# Patient Record
Sex: Male | Born: 1945
Health system: Southern US, Community
[De-identification: ages and names within clinical notes are randomized; demographics above are authoritative.]

## PROBLEM LIST (undated history)

## (undated) DIAGNOSIS — I208 Other forms of angina pectoris: Secondary | ICD-10-CM

## (undated) DIAGNOSIS — I2510 Atherosclerotic heart disease of native coronary artery without angina pectoris: Secondary | ICD-10-CM

## (undated) DIAGNOSIS — R0789 Other chest pain: Secondary | ICD-10-CM

## (undated) DIAGNOSIS — E785 Hyperlipidemia, unspecified: Secondary | ICD-10-CM

## (undated) DIAGNOSIS — R600 Localized edema: Secondary | ICD-10-CM

## (undated) DIAGNOSIS — I10 Essential (primary) hypertension: Secondary | ICD-10-CM

## (undated) DIAGNOSIS — G8929 Other chronic pain: Secondary | ICD-10-CM

## (undated) DIAGNOSIS — I2089 Other forms of angina pectoris: Secondary | ICD-10-CM

## (undated) HISTORY — DX: Atherosclerotic heart disease of native coronary artery without angina pectoris: I25.10

## (undated) HISTORY — DX: Other chest pain: R07.89

## (undated) HISTORY — PX: CORONARY ANGIOPLASTY: SHX604

## (undated) HISTORY — PX: REPAIR OF PERFORATED ULCER: SHX6065

## (undated) HISTORY — PX: KIDNEY STONE SURGERY: SHX686

## (undated) HISTORY — DX: Other forms of angina pectoris: I20.89

## (undated) HISTORY — DX: Other forms of angina pectoris: I20.8

## (undated) HISTORY — PX: CARDIAC CATHETERIZATION: SHX172

## (undated) HISTORY — DX: Localized edema: R60.0

## (undated) HISTORY — PX: OTHER SURGICAL HISTORY: SHX169

## (undated) HISTORY — DX: Hyperlipidemia, unspecified: E78.5

## (undated) HISTORY — DX: Essential (primary) hypertension: I10

## (undated) HISTORY — DX: Other chronic pain: G89.29

## (undated) HISTORY — PX: VASECTOMY: SHX75

---

## 1997-10-25 ENCOUNTER — Emergency Department (HOSPITAL_COMMUNITY): Admission: EM | Admit: 1997-10-25 | Discharge: 1997-10-25 | Payer: Self-pay | Admitting: Emergency Medicine

## 1997-10-25 ENCOUNTER — Encounter: Payer: Self-pay | Admitting: Emergency Medicine

## 1997-10-28 ENCOUNTER — Ambulatory Visit (HOSPITAL_COMMUNITY): Admission: RE | Admit: 1997-10-28 | Discharge: 1997-10-28 | Payer: Self-pay | Admitting: Cardiology

## 2003-08-30 DIAGNOSIS — I2510 Atherosclerotic heart disease of native coronary artery without angina pectoris: Secondary | ICD-10-CM

## 2003-08-30 HISTORY — DX: Atherosclerotic heart disease of native coronary artery without angina pectoris: I25.10

## 2003-09-10 ENCOUNTER — Inpatient Hospital Stay (HOSPITAL_COMMUNITY): Admission: EM | Admit: 2003-09-10 | Discharge: 2003-09-14 | Payer: Self-pay | Admitting: Emergency Medicine

## 2003-09-30 ENCOUNTER — Encounter (HOSPITAL_COMMUNITY): Admission: RE | Admit: 2003-09-30 | Discharge: 2003-12-29 | Payer: Self-pay | Admitting: Cardiology

## 2004-01-01 ENCOUNTER — Ambulatory Visit (HOSPITAL_COMMUNITY): Admission: RE | Admit: 2004-01-01 | Discharge: 2004-01-01 | Payer: Self-pay | Admitting: Family Medicine

## 2004-02-26 ENCOUNTER — Ambulatory Visit: Payer: Self-pay | Admitting: Internal Medicine

## 2004-05-26 ENCOUNTER — Ambulatory Visit: Payer: Self-pay

## 2004-09-08 ENCOUNTER — Ambulatory Visit: Payer: Self-pay

## 2004-11-19 ENCOUNTER — Ambulatory Visit (HOSPITAL_COMMUNITY): Admission: RE | Admit: 2004-11-19 | Discharge: 2004-11-20 | Payer: Self-pay | Admitting: Cardiology

## 2004-11-26 ENCOUNTER — Ambulatory Visit: Payer: Self-pay | Admitting: Cardiology

## 2005-06-30 ENCOUNTER — Encounter: Payer: Self-pay | Admitting: Cardiology

## 2006-04-26 ENCOUNTER — Encounter: Admission: RE | Admit: 2006-04-26 | Discharge: 2006-05-24 | Payer: Self-pay | Admitting: Neurology

## 2012-11-25 ENCOUNTER — Other Ambulatory Visit: Payer: Self-pay | Admitting: Cardiology

## 2012-11-25 DIAGNOSIS — E7800 Pure hypercholesterolemia, unspecified: Secondary | ICD-10-CM

## 2012-11-25 DIAGNOSIS — Z79899 Other long term (current) drug therapy: Secondary | ICD-10-CM

## 2012-12-04 ENCOUNTER — Other Ambulatory Visit: Payer: Self-pay

## 2012-12-05 ENCOUNTER — Other Ambulatory Visit (INDEPENDENT_AMBULATORY_CARE_PROVIDER_SITE_OTHER): Payer: Medicare Other

## 2012-12-05 DIAGNOSIS — E7800 Pure hypercholesterolemia, unspecified: Secondary | ICD-10-CM

## 2012-12-05 DIAGNOSIS — Z79899 Other long term (current) drug therapy: Secondary | ICD-10-CM

## 2012-12-05 LAB — LIPID PANEL
Cholesterol: 124 mg/dL (ref 0–200)
HDL: 38.3 mg/dL — ABNORMAL LOW (ref >39.00)
LDL Cholesterol: 66 mg/dL (ref 0–99)
Total CHOL/HDL Ratio: 3
Triglycerides: 100 mg/dL (ref 0.0–149.0)
VLDL: 20 mg/dL (ref 0.0–40.0)

## 2012-12-30 ENCOUNTER — Other Ambulatory Visit: Payer: Self-pay | Admitting: Cardiology

## 2013-02-02 ENCOUNTER — Other Ambulatory Visit: Payer: Self-pay | Admitting: Cardiology

## 2013-02-02 ENCOUNTER — Encounter: Payer: Self-pay | Admitting: General Surgery

## 2013-02-02 DIAGNOSIS — I2510 Atherosclerotic heart disease of native coronary artery without angina pectoris: Secondary | ICD-10-CM

## 2013-02-02 DIAGNOSIS — I208 Other forms of angina pectoris: Secondary | ICD-10-CM

## 2013-02-02 DIAGNOSIS — I25119 Atherosclerotic heart disease of native coronary artery with unspecified angina pectoris: Secondary | ICD-10-CM | POA: Insufficient documentation

## 2013-02-02 DIAGNOSIS — E7800 Pure hypercholesterolemia, unspecified: Secondary | ICD-10-CM

## 2013-02-02 DIAGNOSIS — Z79899 Other long term (current) drug therapy: Secondary | ICD-10-CM

## 2013-02-07 ENCOUNTER — Encounter: Payer: Self-pay | Admitting: Cardiology

## 2013-02-07 ENCOUNTER — Ambulatory Visit (INDEPENDENT_AMBULATORY_CARE_PROVIDER_SITE_OTHER): Payer: Medicare Other | Admitting: Cardiology

## 2013-02-07 VITALS — BP 142/82 | HR 64 | Wt 198.6 lb

## 2013-02-07 DIAGNOSIS — E7800 Pure hypercholesterolemia, unspecified: Secondary | ICD-10-CM

## 2013-02-07 DIAGNOSIS — R0789 Other chest pain: Secondary | ICD-10-CM | POA: Insufficient documentation

## 2013-02-07 DIAGNOSIS — Z79899 Other long term (current) drug therapy: Secondary | ICD-10-CM

## 2013-02-07 DIAGNOSIS — I209 Angina pectoris, unspecified: Secondary | ICD-10-CM

## 2013-02-07 DIAGNOSIS — I2510 Atherosclerotic heart disease of native coronary artery without angina pectoris: Secondary | ICD-10-CM

## 2013-02-07 DIAGNOSIS — I498 Other specified cardiac arrhythmias: Secondary | ICD-10-CM

## 2013-02-07 DIAGNOSIS — R001 Bradycardia, unspecified: Secondary | ICD-10-CM | POA: Insufficient documentation

## 2013-02-07 DIAGNOSIS — I208 Other forms of angina pectoris: Secondary | ICD-10-CM

## 2013-02-07 NOTE — Patient Instructions (Addendum)
Your physician recommends that you continue on your current medications as directed. Please refer to the Current Medication list given to you today.  Your physician recommends that you return for lab work on 06/07/2013 for fasting LIPID and ALT  Your physician wants you to follow-up in: 6 Months with Dr Sherlyn Lick will receive a reminder letter in the mail two months in advance. If you don't receive a letter, please call our office to schedule the follow-up appointment.

## 2013-02-07 NOTE — Progress Notes (Signed)
[address] [place], [place]  [address] Phone: [phone] Fax:  [phone]  Date:  02/07/2013   ID:  Nathan, Ayala [birth year], MRN [id number]  PCP:  Aida Puffer, MD  Cardiologist:  Armanda Magic, MD     History of Present Illness: Nathan Ayala is a 67 y.o. male with a history of ASCAD, chronic chest wall pain, dyslipidemia and HTN.  He is doing well.  He has chronic left shoulder into his left arm and down his back which is unchanged.  He has chronic stable angina and occasionally takes a NTG but has never had to take more than 1.  He says his anginal pain is very stable and has not taken a NTG in a few months.  He denies any SOB, DOE, palpitations, LE edema, syncope.   Wt Readings from Last 3 Encounters:  02/07/13 198 lb 9.6 oz (90.084 kg)  02/02/13 198 lb (89.812 kg)     Past Medical History  Diagnosis Date  . Coronary artery disease 08/2003    s/p inferior lateral myocardial infarction with PCI of Proximal RCA. He is also s/p PTCA stenting of Proximal LAD September 2006  . Atypical back pain     Chronic Secondary to musculoskeletal disease of cervical spine followed by Dr. Anne Hahn  . Atypical chest pain     Chronic Secondary to musculoskeletal disease of cervical spine followed by Dr. Anne Hahn  . Hyperlipidemia     Current Outpatient Prescriptions  Medication Sig Dispense Refill  . aspirin 81 MG tablet Take 81 mg by mouth daily.      . carisoprodol (SOMA) 350 MG tablet Take 350 mg by mouth as needed for muscle spasms.      . clopidogrel (PLAVIX) 75 MG tablet TAKE 1 TABLET DAILY  90 tablet  1  . Coenzyme Q10 (COQ-10) 200 MG CAPS Take 1 capsule by mouth daily.      . CRESTOR 5 MG tablet TAKE ONE-HALF (1/2) TABLET ONCE A DAY  45 tablet  1  . isosorbide mononitrate (IMDUR) 30 MG 24 hr tablet TAKE 1 TABLET DAILY  90 tablet  1  . nitroGLYCERIN (NITROSTAT) 0.4 MG SL tablet Place 0.4 mg under the tongue every 5 (five) minutes as needed for chest pain.      .  Omega-3 Fatty Acids (FISH OIL) 1000 MG CAPS Take 4 capsules by mouth daily.      . pantoprazole (PROTONIX) 40 MG tablet Take 40 mg by mouth as needed.       . vitamin B-12 (CYANOCOBALAMIN) 1000 MCG tablet Take 1,000 mcg by mouth daily.       No current facility-administered medications for this visit.    Allergies:   No Known Allergies  Social History:  The patient  reports that he quit smoking about 42 years ago. His smoking use included Cigarettes. He smoked 0.00 packs per day. He does not have any smokeless tobacco history on file. He reports that he does not drink alcohol or use illicit drugs.   Family History:  The patient's family history includes Diabetes in his mother; Hypertension in his mother; Kidney cancer in his mother.   ROS:  Please see the history of present illness.      All other systems reviewed and negative.   PHYSICAL EXAM: VS:  BP 142/82  Pulse 64  Wt 198 lb 9.6 oz (90.084 kg) Well nourished, well developed, in no acute distress HEENT: normal Neck: no JVD Cardiac:  normal S1, S2; RRR; no murmur Lungs:  clear to auscultation bilaterally, no wheezing, rhonchi or rales Abd: soft, nontender, no hepatomegaly Ext: no edema Skin: warm and dry Neuro:  CNs 2-12 intact, no focal abnormalities noted  EKG:  Sinus bradycardia     ASSESSMENT AND PLAN:  1. ASCAD with chronic stable angina  - continue ASA/Plavix/Imdur 2. Asymptomatic bradycardia 3. Dyslipidemia  - lipids are at goal - will recheck again 05/2013  Followup with me in 6 months    Signed, Armanda Magic, MD 02/07/2013 11:56 AM

## 2013-05-25 ENCOUNTER — Other Ambulatory Visit: Payer: Self-pay | Admitting: Cardiology

## 2013-06-07 ENCOUNTER — Other Ambulatory Visit: Payer: Medicare Other

## 2013-06-14 ENCOUNTER — Other Ambulatory Visit (INDEPENDENT_AMBULATORY_CARE_PROVIDER_SITE_OTHER): Payer: Medicare Other

## 2013-06-14 DIAGNOSIS — E7800 Pure hypercholesterolemia, unspecified: Secondary | ICD-10-CM

## 2013-06-14 LAB — LIPID PANEL
CHOLESTEROL: 123 mg/dL (ref 0–200)
HDL: 35.4 mg/dL — ABNORMAL LOW (ref >39.00)
LDL Cholesterol: 74 mg/dL (ref 0–99)
Total CHOL/HDL Ratio: 3
Triglycerides: 69 mg/dL (ref 0.0–149.0)
VLDL: 13.8 mg/dL (ref 0.0–40.0)

## 2013-06-14 LAB — ALT: ALT: 28 U/L (ref 0–53)

## 2013-06-20 ENCOUNTER — Other Ambulatory Visit: Payer: Self-pay | Admitting: General Surgery

## 2013-06-20 ENCOUNTER — Encounter: Payer: Self-pay | Admitting: General Surgery

## 2013-06-20 DIAGNOSIS — E7800 Pure hypercholesterolemia, unspecified: Secondary | ICD-10-CM

## 2013-07-06 ENCOUNTER — Telehealth: Payer: Self-pay | Admitting: Cardiology

## 2013-07-06 NOTE — Telephone Encounter (Signed)
New message ° ° ° ° °Want lab results °

## 2013-07-06 NOTE — Telephone Encounter (Signed)
Pt address was different then what was on file. Updated for pt and went over lab results with him.

## 2013-07-27 ENCOUNTER — Other Ambulatory Visit: Payer: Self-pay | Admitting: *Deleted

## 2013-07-27 MED ORDER — ISOSORBIDE MONONITRATE ER 30 MG PO TB24: ORAL_TABLET | ORAL | Status: DC

## 2013-08-02 ENCOUNTER — Ambulatory Visit (INDEPENDENT_AMBULATORY_CARE_PROVIDER_SITE_OTHER): Payer: Medicare Other | Admitting: Cardiology

## 2013-08-02 ENCOUNTER — Encounter: Payer: Self-pay | Admitting: Cardiology

## 2013-08-02 VITALS — BP 132/78 | HR 66 | Ht 67.5 in | Wt 203.4 lb

## 2013-08-02 DIAGNOSIS — I2510 Atherosclerotic heart disease of native coronary artery without angina pectoris: Secondary | ICD-10-CM

## 2013-08-02 DIAGNOSIS — E7800 Pure hypercholesterolemia, unspecified: Secondary | ICD-10-CM

## 2013-08-02 DIAGNOSIS — I208 Other forms of angina pectoris: Secondary | ICD-10-CM

## 2013-08-02 DIAGNOSIS — I498 Other specified cardiac arrhythmias: Secondary | ICD-10-CM

## 2013-08-02 DIAGNOSIS — I209 Angina pectoris, unspecified: Secondary | ICD-10-CM

## 2013-08-02 DIAGNOSIS — R001 Bradycardia, unspecified: Secondary | ICD-10-CM

## 2013-08-02 MED ORDER — PANTOPRAZOLE SODIUM 40 MG PO TBEC: 40.0000 mg | DELAYED_RELEASE_TABLET | ORAL | Status: DC | PRN

## 2013-08-02 MED ORDER — FISH OIL 1000 MG PO CAPS: 6.0000 | ORAL_CAPSULE | Freq: Every day | ORAL | Status: DC

## 2013-08-02 NOTE — Progress Notes (Signed)
[facility], [place], [place]  [address] Phone: [phone] Fax:  [phone]  Date:  08/02/2013   ID:  Overton, Boggus 11/27/1945, MRN [id number]  PCP:  Tamsen Roers, MD  Cardiologist:  Fransico Him, MD     History of Present Illness: Nathan Ayala is a 68 y.o. male with a history of ASCAD, chronic chest wall pain, dyslipidemia and HTN. He is doing well. He has chronic stable angina and occasionally takes a NTG but has never had to take more than 1. He says his anginal pain is very stable and has not taken a NTG in a few months. He denies any SOB, DOE, palpitations, syncope.  He has had some LE edema which he attributes to being on the Prednisone for recent ruptured lumbar disc.     Wt Readings from Last 3 Encounters:  08/02/13 203 lb 6.4 oz (92.262 kg)  02/07/13 198 lb 9.6 oz (90.084 kg)  02/02/13 198 lb (89.812 kg)     Past Medical History  Diagnosis Date  . Coronary artery disease 08/2003    s/p inferior lateral myocardial infarction with PCI of Proximal RCA. He is also s/p PTCA stenting of Proximal LAD September 2006  . Atypical back pain     Chronic Secondary to musculoskeletal disease of cervical spine followed by Dr. Jannifer Franklin  . Atypical chest pain     Chronic Secondary to musculoskeletal disease of cervical spine followed by Dr. Jannifer Franklin  . Hyperlipidemia   . Stable angina     Current Outpatient Prescriptions  Medication Sig Dispense Refill  . aspirin 81 MG tablet Take 81 mg by mouth daily.      . carisoprodol (SOMA) 350 MG tablet Take 350 mg by mouth as needed for muscle spasms.      . clopidogrel (PLAVIX) 75 MG tablet TAKE 1 TABLET DAILY  90 tablet  0  . Coenzyme Q10 (COQ-10) 200 MG CAPS Take 1 capsule by mouth daily.      . CRESTOR 5 MG tablet TAKE ONE-HALF (1/2) TABLET ONCE A DAY  45 tablet  0  . isosorbide mononitrate (IMDUR) 30 MG 24 hr tablet TAKE 1 TABLET DAILY  90 tablet  0  . nitroGLYCERIN (NITROSTAT) 0.4 MG SL tablet Place 0.4 mg under the  tongue every 5 (five) minutes as needed for chest pain.      . Omega-3 Fatty Acids (FISH OIL) 1000 MG CAPS Take 4 capsules by mouth daily.      . pantoprazole (PROTONIX) 40 MG tablet Take 40 mg by mouth as needed.       . vitamin B-12 (CYANOCOBALAMIN) 1000 MCG tablet Take 1,000 mcg by mouth daily.       No current facility-administered medications for this visit.    Allergies:   No Known Allergies  Social History:  The patient  reports that he quit smoking about 43 years ago. His smoking use included Cigarettes. He smoked 0.00 packs per day. He does not have any smokeless tobacco history on file. He reports that he does not drink alcohol or use illicit drugs.   Family History:  The patient's family history includes Diabetes in his mother; Hypertension in his mother; Kidney cancer in his mother.   ROS:  Please see the history of present illness.      All other systems reviewed and negative.   PHYSICAL EXAM: VS:  BP 132/78  Pulse 66  Ht 5' 7.5" (1.715 m)  Wt 203 lb 6.4  oz (92.262 kg)  BMI 31.37 kg/m2 Well nourished, well developed, in no acute distress HEENT: normal Neck: no JVD Cardiac:  normal S1, S2; RRR; no murmur Lungs:  clear to auscultation bilaterally, no wheezing, rhonchi or rales Abd: soft, nontender, no hepatomegaly Ext: no edema Skin: warm and dry Neuro:  CNs 2-12 intact, no focal abnormalities noted     ASSESSMENT AND PLAN:  1.  ASCAD with chronic stable angina - continue ASA/Plavix/Imdur  2.  Asymptomatic bradycardia - resolved 3.  Dyslipidemia - lipids are at goal - LDL was 74 but his HDL has dropped to 35 on 05/2013 - continue Crestor - increase fish oil to 3 tablets twice daily  Followup with me in 6 months  Signed, Fransico Him, MD 08/02/2013 11:10 AM

## 2013-08-02 NOTE — Patient Instructions (Signed)
Your physician has recommended you make the following change in your medication:   1. Increase Fish Oil to 6 capsules daily.   Your physician wants you to follow-up in: 6 months with Dr. Radford Pax. You will receive a reminder letter in the mail two months in advance. If you don't receive a letter, please call our office to schedule the follow-up appointment.

## 2013-08-05 ENCOUNTER — Other Ambulatory Visit: Payer: Self-pay | Admitting: Cardiology

## 2013-10-03 ENCOUNTER — Telehealth: Payer: Self-pay | Admitting: Cardiology

## 2013-10-03 NOTE — Telephone Encounter (Signed)
To Dr Turner to advise 

## 2013-10-03 NOTE — Telephone Encounter (Signed)
That is fine 

## 2013-10-03 NOTE — Telephone Encounter (Signed)
Patient has an appt on 10-22 for lab work.  His primary provider (Dr. Tamsen Roers) would like to know if we can draw additional labs and send results to [facility]:VGJF, HbA1C and Vitamins D and B12.  Please let patient know if we can do this so he can get the lab done somewhere else if we cannot draw the additional labs.  I sent a copy of the order on a prescription to medical records to be scanned to patient's chart.

## 2013-10-04 ENCOUNTER — Other Ambulatory Visit: Payer: Self-pay | Admitting: General Surgery

## 2013-10-04 DIAGNOSIS — Z5181 Encounter for therapeutic drug level monitoring: Secondary | ICD-10-CM

## 2013-10-04 DIAGNOSIS — Z79899 Other long term (current) drug therapy: Secondary | ICD-10-CM

## 2013-10-04 DIAGNOSIS — D518 Other vitamin B12 deficiency anemias: Secondary | ICD-10-CM

## 2013-10-04 NOTE — Telephone Encounter (Signed)
Ordered for pt

## 2013-10-04 NOTE — Telephone Encounter (Signed)
Pt is aware.  

## 2013-10-04 NOTE — Telephone Encounter (Signed)
Will have to call pts PCP office to get DX codes, Rx has not yet been scanned in to pts chart. [phone]

## 2013-10-06 ENCOUNTER — Other Ambulatory Visit: Payer: Self-pay | Admitting: Cardiology

## 2013-12-05 ENCOUNTER — Encounter: Payer: Self-pay | Admitting: Cardiology

## 2013-12-05 ENCOUNTER — Telehealth: Payer: Self-pay | Admitting: Cardiology

## 2013-12-05 NOTE — Telephone Encounter (Signed)
New message   Patient calling stating someone called him today - returning call back to nurse.

## 2013-12-05 NOTE — Telephone Encounter (Signed)
Spoke with patient and he had no message just number came up on phone. Advised patient did not see where anyone called him. Told him did not look like anyone tried to call him but would check with Amy S CMA to be sure. Did remind patient of lab appointment later this month.

## 2013-12-20 ENCOUNTER — Other Ambulatory Visit (INDEPENDENT_AMBULATORY_CARE_PROVIDER_SITE_OTHER): Payer: Medicare Other | Admitting: *Deleted

## 2013-12-20 DIAGNOSIS — Z5181 Encounter for therapeutic drug level monitoring: Secondary | ICD-10-CM

## 2013-12-20 DIAGNOSIS — D518 Other vitamin B12 deficiency anemias: Secondary | ICD-10-CM | POA: Diagnosis not present

## 2013-12-20 DIAGNOSIS — Z79899 Other long term (current) drug therapy: Secondary | ICD-10-CM | POA: Diagnosis not present

## 2013-12-20 DIAGNOSIS — E7800 Pure hypercholesterolemia, unspecified: Secondary | ICD-10-CM

## 2013-12-20 LAB — BASIC METABOLIC PANEL
BUN: 20 mg/dL (ref 6–23)
CALCIUM: 9.7 mg/dL (ref 8.4–10.5)
CO2: 25 mEq/L (ref 19–32)
Chloride: 107 mEq/L (ref 96–112)
Creatinine, Ser: 1.4 mg/dL (ref 0.4–1.5)
GFR: 54.57 mL/min — AB (ref >60.00)
Glucose, Bld: 96 mg/dL (ref 70–99)
POTASSIUM: 5.7 meq/L — AB (ref 3.5–5.1)
SODIUM: 141 meq/L (ref 135–145)

## 2013-12-20 LAB — HEPATIC FUNCTION PANEL
ALT: 28 U/L (ref 0–53)
AST: 23 U/L (ref 0–37)
Albumin: 3.8 g/dL (ref 3.5–5.2)
Alkaline Phosphatase: 66 U/L (ref 39–117)
Bilirubin, Direct: 0.3 mg/dL (ref 0.0–0.3)
TOTAL PROTEIN: 7.3 g/dL (ref 6.0–8.3)
Total Bilirubin: 2.4 mg/dL — ABNORMAL HIGH (ref 0.2–1.2)

## 2013-12-20 LAB — LIPID PANEL
Cholesterol: 134 mg/dL (ref 0–200)
HDL: 36.7 mg/dL — AB (ref >39.00)
LDL CALC: 81 mg/dL (ref 0–99)
NONHDL: 97.3
TRIGLYCERIDES: 82 mg/dL (ref 0.0–149.0)
Total CHOL/HDL Ratio: 4
VLDL: 16.4 mg/dL (ref 0.0–40.0)

## 2013-12-20 LAB — VITAMIN B12: VITAMIN B 12: 470 pg/mL (ref 211–911)

## 2013-12-20 LAB — VITAMIN D 25 HYDROXY (VIT D DEFICIENCY, FRACTURES): VITD: 38.96 ng/mL (ref 30.00–100.00)

## 2013-12-20 LAB — HEMOGLOBIN A1C: HEMOGLOBIN A1C: 5.6 % (ref 4.6–6.5)

## 2013-12-21 ENCOUNTER — Other Ambulatory Visit: Payer: Self-pay

## 2013-12-21 ENCOUNTER — Telehealth: Payer: Self-pay

## 2013-12-21 DIAGNOSIS — E7800 Pure hypercholesterolemia, unspecified: Secondary | ICD-10-CM

## 2013-12-21 MED ORDER — ROSUVASTATIN CALCIUM 5 MG PO TABS: 5.0000 mg | ORAL_TABLET | Freq: Every day | ORAL | Status: DC

## 2013-12-21 NOTE — Telephone Encounter (Signed)
Informed Nathan Ayala that LDL not at goal.  Instructed patient to increase crestor 5mg  to 1 tablet daily. Appointment made to recheck FLP and ALT in 6 weeks (Dec 2nd). Forwarded all labs to Dr. Rex Kras to followup on borderline low Vit D and elevated Bili. Patient agrees with treatment plan.

## 2013-12-23 ENCOUNTER — Other Ambulatory Visit: Payer: Self-pay | Admitting: Cardiology

## 2013-12-29 ENCOUNTER — Other Ambulatory Visit: Payer: Self-pay | Admitting: Cardiology

## 2014-01-30 ENCOUNTER — Encounter: Payer: Self-pay | Admitting: Cardiology

## 2014-01-30 ENCOUNTER — Other Ambulatory Visit (INDEPENDENT_AMBULATORY_CARE_PROVIDER_SITE_OTHER): Payer: Medicare Other | Admitting: *Deleted

## 2014-01-30 ENCOUNTER — Ambulatory Visit (INDEPENDENT_AMBULATORY_CARE_PROVIDER_SITE_OTHER): Payer: Medicare Other | Admitting: Cardiology

## 2014-01-30 VITALS — BP 118/88 | HR 64 | Resp 18 | Ht 68.0 in | Wt 200.4 lb

## 2014-01-30 DIAGNOSIS — E7800 Pure hypercholesterolemia, unspecified: Secondary | ICD-10-CM

## 2014-01-30 DIAGNOSIS — I25118 Atherosclerotic heart disease of native coronary artery with other forms of angina pectoris: Secondary | ICD-10-CM

## 2014-01-30 DIAGNOSIS — R001 Bradycardia, unspecified: Secondary | ICD-10-CM

## 2014-01-30 DIAGNOSIS — I2510 Atherosclerotic heart disease of native coronary artery without angina pectoris: Secondary | ICD-10-CM

## 2014-01-30 DIAGNOSIS — I208 Other forms of angina pectoris: Secondary | ICD-10-CM

## 2014-01-30 NOTE — Patient Instructions (Signed)
Your physician recommends that you continue on your current medications as directed. Please refer to the Current Medication list given to you today.    Your physician wants you to follow-up in: 6 months with DR TURNER  You will receive a reminder letter in the mail two months in advance. If you don't receive a letter, please call our office to schedule the follow-up appointment.

## 2014-01-30 NOTE — Progress Notes (Signed)
[facility], [place], [place]  [address] Phone: [phone] Fax:  [phone]  Date:  01/30/2014   ID:  Arlon, Bleier 1946-02-13, MRN [id number]  PCP:  Tamsen Roers, MD  Cardiologist:  Fransico Him, MD    History of Present Illness: Nathan Ayala is a 68 y.o. male with a history of ASCAD, chronic chest wall pain, dyslipidemia and HTN. He is doing well. He has chronic stable angina and occasionally takes a NTG but has never had to take more than 1. He says his anginal pain is very stable and has not taken a NTG in 6 months. He denies any SOB, DOE, palpitations, syncope. His LE edema has resolved.  Wt Readings from Last 3 Encounters:  01/30/14 200 lb 6.4 oz (90.901 kg)  08/02/13 203 lb 6.4 oz (92.262 kg)  02/07/13 198 lb 9.6 oz (90.084 kg)     Past Medical History  Diagnosis Date  . Coronary artery disease 08/2003    s/p inferior lateral myocardial infarction with PCI of Proximal RCA. He is also s/p PTCA stenting of Proximal LAD September 2006  . Atypical back pain     Chronic Secondary to musculoskeletal disease of cervical spine followed by Dr. Jannifer Franklin  . Atypical chest pain     Chronic Secondary to musculoskeletal disease of cervical spine followed by Dr. Jannifer Franklin  . Hyperlipidemia   . Stable angina     Current Outpatient Prescriptions  Medication Sig Dispense Refill  . aspirin 81 MG tablet Take 81 mg by mouth daily.    . carisoprodol (SOMA) 350 MG tablet Take 350 mg by mouth as needed for muscle spasms.    . clopidogrel (PLAVIX) 75 MG tablet TAKE 1 TABLET DAILY 90 tablet 0  . Coenzyme Q10 (COQ-10) 200 MG CAPS Take 1 capsule by mouth daily.    . isosorbide mononitrate (IMDUR) 30 MG 24 hr tablet TAKE 1 TABLET DAILY 90 tablet 1  . nitroGLYCERIN (NITROSTAT) 0.4 MG SL tablet Place 0.4 mg under the tongue every 5 (five) minutes as needed for chest pain.    . Omega-3 Fatty Acids (FISH OIL) 1000 MG CAPS Take 6 capsules (6,000 mg total) by mouth daily.    .  pantoprazole (PROTONIX) 40 MG tablet TAKE 1 TABLET AS NEEDED 90 tablet 0  . rosuvastatin (CRESTOR) 5 MG tablet Take 1 tablet (5 mg total) by mouth daily at 6 PM. 45 tablet 1  . vitamin B-12 (CYANOCOBALAMIN) 1000 MCG tablet Take 1,000 mcg by mouth daily.     No current facility-administered medications for this visit.    Allergies:   No Known Allergies  Social History:  The patient  reports that he quit smoking about 43 years ago. His smoking use included Cigarettes. He smoked 0.00 packs per day. He does not have any smokeless tobacco history on file. He reports that he does not drink alcohol or use illicit drugs.   Family History:  The patient's family history includes Diabetes in his mother; Hypertension in his mother; Kidney cancer in his mother.   ROS:  Please see the history of present illness.      All other systems reviewed and negative.   PHYSICAL EXAM: VS:  BP 118/88 mmHg  Pulse 64  Resp 18  Ht 5\' 8"  (1.727 m)  Wt 200 lb 6.4 oz (90.901 kg)  BMI 30.48 kg/m2  SpO2 97% Well nourished, well developed, in no acute distress HEENT: normal Neck: no JVD Cardiac:  normal S1,  S2; RRR; no murmur Lungs:  clear to auscultation bilaterally, no wheezing, rhonchi or rales Abd: soft, nontender, no hepatomegaly Ext: no edematrace distal pulses Skin: warm and dry Neuro:  CNs 2-12 intact, no focal abnormalities noted  EKG:  Sinus bradycardia at 56bpm with inferior infarct  ASSESSMENT AND PLAN:  1. ASCAD with chronic stable angina - continue ASA/Plavix/Imdur  2. Asymptomatic bradycardia  3. Dyslipidemia - lipids are above goal - LDL was 81 and HDL 36 on 11/2013 - continue Crestor (this was increased recently and he has lipids pending this am)   Followup with me in 6 months     Signed, Fransico Him, MD [facility] 01/30/2014 11:43 AM

## 2014-01-31 LAB — LIPID PANEL
CHOL/HDL RATIO: 3
CHOLESTEROL: 102 mg/dL (ref 0–200)
HDL: 30.4 mg/dL — ABNORMAL LOW (ref >39.00)
LDL CALC: 60 mg/dL (ref 0–99)
NONHDL: 71.6
Triglycerides: 58 mg/dL (ref 0.0–149.0)
VLDL: 11.6 mg/dL (ref 0.0–40.0)

## 2014-01-31 LAB — ALT: ALT: 25 U/L (ref 0–53)

## 2014-02-04 ENCOUNTER — Other Ambulatory Visit (INDEPENDENT_AMBULATORY_CARE_PROVIDER_SITE_OTHER): Payer: Medicare Other

## 2014-02-04 DIAGNOSIS — E875 Hyperkalemia: Secondary | ICD-10-CM

## 2014-02-04 LAB — BASIC METABOLIC PANEL
BUN: 17 mg/dL (ref 6–23)
CO2: 21 mEq/L (ref 19–32)
Calcium: 9.2 mg/dL (ref 8.4–10.5)
Chloride: 109 mEq/L (ref 96–112)
Creatinine, Ser: 1.5 mg/dL (ref 0.4–1.5)
GFR: 50.29 mL/min — AB (ref >60.00)
Glucose, Bld: 75 mg/dL (ref 70–99)
Potassium: 5 mEq/L (ref 3.5–5.1)
SODIUM: 141 meq/L (ref 135–145)

## 2014-03-01 ENCOUNTER — Other Ambulatory Visit: Payer: Self-pay | Admitting: Cardiology

## 2014-03-08 ENCOUNTER — Other Ambulatory Visit: Payer: Self-pay | Admitting: Cardiology

## 2014-03-12 ENCOUNTER — Other Ambulatory Visit: Payer: Self-pay | Admitting: Cardiology

## 2014-03-13 ENCOUNTER — Other Ambulatory Visit: Payer: Self-pay | Admitting: Cardiology

## 2014-05-07 ENCOUNTER — Telehealth: Payer: Self-pay | Admitting: Cardiology

## 2014-05-07 DIAGNOSIS — E7800 Pure hypercholesterolemia, unspecified: Secondary | ICD-10-CM

## 2014-05-07 NOTE — Telephone Encounter (Signed)
New message     Pt is coming in 07-30-14 for a 57mo follow up.  He says he needs to have labs drawn prior to appt.  Please call pt and let him know if he needs labs and put the order in the computer.  thanks

## 2014-05-20 NOTE — Telephone Encounter (Signed)
Left message to call back  

## 2014-05-20 NOTE — Telephone Encounter (Signed)
FLP and LFT

## 2014-05-21 ENCOUNTER — Encounter: Payer: Self-pay | Admitting: Cardiology

## 2014-05-21 NOTE — Telephone Encounter (Signed)
New message ° ° ° ° °Pt returning Katy's call °

## 2014-05-21 NOTE — Telephone Encounter (Signed)
This encounter was created in error - please disregard.

## 2014-05-21 NOTE — Telephone Encounter (Signed)
Fasting lab appointment scheduled for May 27. Patient agrees with treatment plan.

## 2014-05-23 ENCOUNTER — Other Ambulatory Visit: Payer: Self-pay | Admitting: Cardiology

## 2014-07-26 ENCOUNTER — Other Ambulatory Visit (INDEPENDENT_AMBULATORY_CARE_PROVIDER_SITE_OTHER): Payer: Medicare Other | Admitting: *Deleted

## 2014-07-26 DIAGNOSIS — E7800 Pure hypercholesterolemia, unspecified: Secondary | ICD-10-CM

## 2014-07-26 LAB — ALT: ALT: 26 U/L (ref 0–53)

## 2014-07-26 LAB — LIPID PANEL
CHOL/HDL RATIO: 3
CHOLESTEROL: 105 mg/dL (ref 0–200)
HDL: 36.4 mg/dL — ABNORMAL LOW (ref >39.00)
LDL CALC: 54 mg/dL (ref 0–99)
NonHDL: 68.6
Triglycerides: 72 mg/dL (ref 0.0–149.0)
VLDL: 14.4 mg/dL (ref 0.0–40.0)

## 2014-07-26 NOTE — Addendum Note (Signed)
Addended by: Eulis Foster on: 07/26/2014 10:39 AM   Modules accepted: Orders

## 2014-07-29 NOTE — Progress Notes (Signed)
Cardiology Office Note   Date:  07/30/2014   ID:  Nathan, Ayala Jul 04, 1945, MRN [id number]  PCP:  Nathan Roers, MD    Chief Complaint  Patient presents with  . Follow-up    bradycardia      History of Present Illness: Nathan Ayala is a 69 y.o. male with a history of ASCAD, chronic chest wall pain, dyslipidemia and HTN. He is doing well. He has chronic stable angina and occasionally takes a NTG but has never had to take more than 1. He says his anginal pain is very stable and has only taken NTG on 2 occasions due to chest tightness and SOB. He denies any SOB, DOE, palpitations, dizziness or syncope. His LE edema has resolved.    Past Medical History  Diagnosis Date  . Coronary artery disease 08/2003    s/p inferior lateral myocardial infarction with PCI of Proximal RCA. He is also s/p PTCA stenting of Proximal LAD September 2006  . Atypical back pain     Chronic Secondary to musculoskeletal disease of cervical spine followed by Dr. Jannifer Ayala  . Atypical chest pain     Chronic Secondary to musculoskeletal disease of cervical spine followed by Dr. Jannifer Ayala  . Hyperlipidemia   . Stable angina     Past Surgical History  Procedure Laterality Date  . Cardiac catheterization    . Coronary angioplasty    . Arthroscopic knee surgery    . Repair of perforated ulcer    . Hernia repair    . Kidney stone surgery       Current Outpatient Prescriptions  Medication Sig Dispense Refill  . aspirin 81 MG tablet Take 81 mg by mouth daily.    . carisoprodol (SOMA) 350 MG tablet Take 350 mg by mouth as needed for muscle spasms.    . clopidogrel (PLAVIX) 75 MG tablet TAKE 1 TABLET DAILY 90 tablet 2  . Coenzyme Q10 (COQ-10) 200 MG CAPS Take 1 capsule by mouth daily.    . isosorbide mononitrate (IMDUR) 30 MG 24 hr tablet TAKE 1 TABLET DAILY 90 tablet 1  . nitroGLYCERIN (NITROSTAT) 0.4 MG SL tablet Place 0.4 mg under the tongue every 5 (five) minutes as needed for chest  pain.    . Omega-3 Fatty Acids (FISH OIL) 1000 MG CAPS Take 6 capsules (6,000 mg total) by mouth daily.    . pantoprazole (PROTONIX) 40 MG tablet TAKE 1 TABLET AS NEEDED 90 tablet 1  . rosuvastatin (CRESTOR) 5 MG tablet Take 1 tablet (5 mg total) by mouth daily at 6 PM. (Patient taking differently: Take 5 mg by mouth daily with supper. ) 45 tablet 1  . vitamin B-12 (CYANOCOBALAMIN) 1000 MCG tablet Take 1,000 mcg by mouth daily.     No current facility-administered medications for this visit.    Allergies:   Review of patient's allergies indicates no known allergies.    Social History:  The patient  reports that he quit smoking about 44 years ago. His smoking use included Cigarettes. He does not have any smokeless tobacco history on file. He reports that he does not drink alcohol or use illicit drugs.   Family History:  The patient's family history includes Diabetes in his mother; Hypertension in his mother; Kidney cancer in his mother.    ROS:  Please see the history of present illness.   Otherwise, review of systems are positive for  none.   All other systems are reviewed and negative.    PHYSICAL EXAM: VS:  BP 130/96 mmHg  Pulse 65  Ht 5\' 8"  (1.727 m)  Wt 203 lb 1.9 oz (92.135 kg)  BMI 30.89 kg/m2  SpO2 96% , BMI Body mass index is 30.89 kg/(m^2). GEN: Well nourished, well developed, in no acute distress HEENT: normal Neck: no JVD, carotid bruits, or masses Cardiac: RRR; no murmurs, rubs, or gallops,no edema  Respiratory:  clear to auscultation bilaterally, normal work of breathing GI: soft, nontender, nondistended, + BS MS: no deformity or atrophy Skin: warm and dry, no rash Neuro:  Strength and sensation are intact Psych: euthymic mood, full affect   EKG:  EKG is not ordered today.    Recent Labs: 02/04/2014: BUN 17; Creatinine 1.5; Potassium 5.0; Sodium 141 07/26/2014: ALT 26    Lipid Panel    Component Value Date/Time   CHOL 105 07/26/2014 1039   TRIG 72.0  07/26/2014 1039   HDL 36.40* 07/26/2014 1039   CHOLHDL 3 07/26/2014 1039   VLDL 14.4 07/26/2014 1039   LDLCALC 54 07/26/2014 1039      Wt Readings from Last 3 Encounters:  07/30/14 203 lb 1.9 oz (92.135 kg)  01/30/14 200 lb 6.4 oz (90.901 kg)  08/02/13 203 lb 6.4 oz (92.262 kg)    ASSESSMENT AND PLAN:  1. ASCAD with chronic stable angina - continue ASA/Plavix/Imdur  2. Asymptomatic bradycardia - resolved 3. Dyslipidemia - lipids are at goal - LDL was 54 06/2014  - continue Crestor  4.  Chronic stable angina - on Imdur 5.  Elevated BP - we discussed adding amlodipine 2.5mg  daily but he wants to hold off.  He will check his BP daily for a week and call with results   Current medicines are reviewed at length with the patient today.  The patient does not have concerns regarding medicines.  The following changes have been made: None  Labs/ tests ordered today: See above Assessment and Plan No orders of the defined types were placed in this encounter.     Disposition:   FU with me in 6 months  Signed, Nathan Margarita, MD  07/30/2014 11:17 AM    [facility] [place], [place], [place]  [address] Phone: [phone]; Fax: [phone]

## 2014-07-30 ENCOUNTER — Ambulatory Visit (INDEPENDENT_AMBULATORY_CARE_PROVIDER_SITE_OTHER): Payer: Medicare Other | Admitting: Cardiology

## 2014-07-30 ENCOUNTER — Encounter: Payer: Self-pay | Admitting: Cardiology

## 2014-07-30 VITALS — BP 130/96 | HR 65 | Ht 68.0 in | Wt 203.1 lb

## 2014-07-30 DIAGNOSIS — E7800 Pure hypercholesterolemia, unspecified: Secondary | ICD-10-CM

## 2014-07-30 DIAGNOSIS — R030 Elevated blood-pressure reading, without diagnosis of hypertension: Secondary | ICD-10-CM

## 2014-07-30 DIAGNOSIS — I10 Essential (primary) hypertension: Secondary | ICD-10-CM

## 2014-07-30 DIAGNOSIS — I208 Other forms of angina pectoris: Secondary | ICD-10-CM | POA: Diagnosis not present

## 2014-07-30 DIAGNOSIS — I25118 Atherosclerotic heart disease of native coronary artery with other forms of angina pectoris: Secondary | ICD-10-CM | POA: Diagnosis not present

## 2014-07-30 DIAGNOSIS — IMO0001 Reserved for inherently not codable concepts without codable children: Secondary | ICD-10-CM

## 2014-07-30 DIAGNOSIS — R001 Bradycardia, unspecified: Secondary | ICD-10-CM

## 2014-07-30 HISTORY — DX: Essential (primary) hypertension: I10

## 2014-07-30 NOTE — Patient Instructions (Addendum)
Medication Instructions:  Your physician recommends that you continue on your current medications as directed. Please refer to the Current Medication list given to you today.   Labwork: None  Testing/Procedures: None  Follow-Up: Your physician wants you to follow-up in: 6 months with Dr. Radford Pax. You will receive a reminder letter in the mail two months in advance. If you don't receive a letter, please call our office to schedule the follow-up appointment.   Any Other Special Instructions Will Be Listed Below (If Applicable). Please check your BLOOD PRESSURE daily for one week and call with results.  Cardiac Diet This diet can help prevent heart disease and stroke. Many factors influence your heart health, including eating and exercise habits. Coronary risk rises a lot with abnormal blood fat (lipid) levels. Cardiac meal planning includes limiting unhealthy fats, increasing healthy fats, and making other small dietary changes. General guidelines are as follows:  Adjust calorie intake to reach and maintain desirable body weight.  Limit total fat intake to less than 30% of total calories. Saturated fat should be less than 7% of calories.  Saturated fats are found in animal products and in some vegetable products. Saturated vegetable fats are found in coconut oil, cocoa butter, palm oil, and palm kernel oil. Read labels carefully to avoid these products as much as possible. Use butter in moderation. Choose tub margarines and oils that have 2 grams of fat or less. Good cooking oils are canola and olive oils.  Practice low-fat cooking techniques. Do not fry food. Instead, broil, bake, boil, steam, grill, roast on a rack, stir-fry, or microwave it. Other fat reducing suggestions include:  Remove the skin from poultry.  Remove all visible fat from meats.  Skim the fat off stews, soups, and gravies before serving them.  Steam vegetables in water or broth instead of sauting them in  fat.  Avoid foods with trans fat (or hydrogenated oils), such as commercially fried foods and commercially baked goods. Commercial shortening and deep-frying fats will contain trans fat.  Increase intake of fruits, vegetables, whole grains, and legumes to replace foods high in fat.  Increase consumption of nuts, legumes, and seeds to at least 4 servings weekly. One serving of a legume equals  cup, and 1 serving of nuts or seeds equals  cup.  Choose whole grains more often. Have 3 servings per day (a serving is 1 ounce [oz]).  Eat 4 to 5 servings of vegetables per day. A serving of vegetables is 1 cup of raw leafy vegetables;  cup of raw or cooked cut-up vegetables;  cup of vegetable juice.  Eat 4 to 5 servings of fruit per day. A serving of fruit is 1 medium whole fruit;  cup of dried fruit;  cup of fresh, frozen, or canned fruit;  cup of 100% fruit juice.  Increase your intake of dietary fiber to 20 to 30 grams per day. Insoluble fiber may help lower your risk of heart disease and may help curb your appetite.  Soluble fiber binds cholesterol to be removed from the blood. Foods high in soluble fiber are dried beans, citrus fruits, oats, apples, bananas, broccoli, Brussels sprouts, and eggplant.  Try to include foods fortified with plant sterols or stanols, such as yogurt, breads, juices, or margarines. Choose several fortified foods to achieve a daily intake of 2 to 3 grams of plant sterols or stanols.  Foods with omega-3 fats can help reduce your risk of heart disease. Aim to have a 3.5 oz portion of  fatty fish twice per week, such as salmon, mackerel, albacore tuna, sardines, lake trout, or herring. If you wish to take a fish oil supplement, choose one that contains 1 gram of both DHA and EPA.  Limit processed meats to 2 servings (3 oz portion) weekly.  Limit the sodium in your diet to 1500 milligrams (mg) per day. If you have high blood pressure, talk to a registered dietitian about  a DASH (Dietary Approaches to Stop Hypertension) eating plan.  Limit sweets and beverages with added sugar, such as soda, to no more than 5 servings per week. One serving is:   1 tablespoon sugar.  1 tablespoon jelly or jam.   cup sorbet.  1 cup lemonade.   cup regular soda. CHOOSING FOODS Starches  Allowed: Breads: All kinds (wheat, rye, raisin, white, oatmeal, [place], [place], and English muffin bread). Low-fat rolls: English muffins, frankfurter and hamburger buns, bagels, pita bread, tortillas (not fried). Pancakes, waffles, biscuits, and muffins made with recommended oil.  Avoid: Products made with saturated or trans fats, oils, or whole milk products. Butter rolls, cheese breads, croissants. Commercial doughnuts, muffins, sweet rolls, biscuits, waffles, pancakes, store-bought mixes. Crackers  Allowed: Low-fat crackers and snacks: Animal, graham, rye, saltine (with recommended oil, no lard), oyster, and matzo crackers. Bread sticks, melba toast, rusks, flatbread, pretzels, and light popcorn.  Avoid: High-fat crackers: cheese crackers, butter crackers, and those made with coconut, palm oil, or trans fat (hydrogenated oils). Buttered popcorn. Cereals  Allowed: Hot or cold whole-grain cereals.  Avoid: Cereals containing coconut, hydrogenated vegetable fat, or animal fat. Potatoes / Pasta / Rice  Allowed: All kinds of potatoes, rice, and pasta (such as macaroni, spaghetti, and noodles).  Avoid: Pasta or rice prepared with cream sauce or high-fat cheese. Chow mein noodles, [place] fries. Vegetables  Allowed: All vegetables and vegetable juices.  Avoid: Fried vegetables. Vegetables in cream, butter, or high-fat cheese sauces. Limit coconut. Fruit in cream or custard. Protein  Allowed: Limit your intake of meat, seafood, and poultry to no more than 6 oz (cooked weight) per day. All lean, well-trimmed beef, veal, pork, and lamb. All chicken and [place] without skin. All fish  and shellfish. Wild game: wild duck, rabbit, pheasant, and venison. Egg whites or low-cholesterol egg substitutes may be used as desired. Meatless dishes: recipes with dried beans, peas, lentils, and tofu (soybean curd). Seeds and nuts: all seeds and most nuts.  Avoid: Prime grade and other heavily marbled and fatty meats, such as short ribs, spare ribs, rib eye roast or steak, frankfurters, sausage, bacon, and high-fat luncheon meats, mutton. Caviar. Commercially fried fish. Domestic duck, goose, venison sausage. Organ meats: liver, gizzard, heart, chitterlings, brains, kidney, sweetbreads. Dairy  Allowed: Low-fat cheeses: nonfat or low-fat cottage cheese (1% or 2% fat), cheeses made with part skim milk, such as mozzarella, farmers, string, or ricotta. (Cheeses should be labeled no more than 2 to 6 grams fat per oz.). Skim (or 1%) milk: liquid, powdered, or evaporated. Buttermilk made with low-fat milk. Drinks made with skim or low-fat milk or cocoa. Chocolate milk or cocoa made with skim or low-fat (1%) milk. Nonfat or low-fat yogurt.  Avoid: Whole milk cheeses, including colby, cheddar, mu[place], Monterey Jack, [place], [place], [place], American, Swiss, and blue. Creamed cottage cheese, cream cheese. Whole milk and whole milk products, including buttermilk or yogurt made from whole milk, drinks made from whole milk. Condensed milk, evaporated whole milk, and 2% milk. Soups and Combination Foods  Allowed: Low-fat low-sodium soups: broth, dehydrated  soups, homemade broth, soups with the fat removed, homemade cream soups made with skim or low-fat milk. Low-fat spaghetti, lasagna, chili, and Spanish rice if low-fat ingredients and low-fat cooking techniques are used.  Avoid: Cream soups made with whole milk, cream, or high-fat cheese. All other soups. Desserts and Sweets  Allowed: Sherbet, fruit ices, gelatins, meringues, and angel food cake. Homemade desserts with recommended fats, oils, and milk  products. Jam, jelly, honey, marmalade, sugars, and syrups. Pure sugar candy, such as gum drops, hard candy, jelly beans, marshmallows, mints, and small amounts of dark chocolate.  Avoid: Commercially prepared cakes, pies, cookies, frosting, pudding, or mixes for these products. Desserts containing whole milk products, chocolate, coconut, lard, palm oil, or palm kernel oil. Ice cream or ice cream drinks. Candy that contains chocolate, coconut, butter, hydrogenated fat, or unknown ingredients. Buttered syrups. Fats and Oils  Allowed: Vegetable oils: safflower, sunflower, corn, soybean, cottonseed, sesame, canola, olive, or peanut. Non-hydrogenated margarines. Salad dressing or mayonnaise: homemade or commercial, made with a recommended oil. Low or nonfat salad dressing or mayonnaise.  Limit added fats and oils to 6 to 8 tsp per day (includes fats used in cooking, baking, salads, and spreads on bread). Remember to count the "hidden fats" in foods.  Avoid: Solid fats and shortenings: butter, lard, salt pork, bacon drippings. Gravy containing meat fat, shortening, or suet. Cocoa butter, coconut. Coconut oil, palm oil, palm kernel oil, or hydrogenated oils: these ingredients are often used in bakery products, nondairy creamers, whipped toppings, candy, and commercially fried foods. Read labels carefully. Salad dressings made of unknown oils, sour cream, or cheese, such as blue cheese and Roquefort. Cream, all kinds: half-and-half, light, heavy, or whipping. Sour cream or cream cheese (even if "light" or low-fat). Nondairy cream substitutes: coffee creamers and sour cream substitutes made with palm, palm kernel, hydrogenated oils, or coconut oil. Beverages  Allowed: Coffee (regular or decaffeinated), tea. Diet carbonated beverages, mineral water. Alcohol: Check with your caregiver. Moderation is recommended.  Avoid: Whole milk, regular sodas, and juice drinks with added sugar. Condiments  Allowed: All  seasonings and condiments. Cocoa powder. "Cream" sauces made with recommended ingredients.  Avoid: Carob powder made with hydrogenated fats. SAMPLE MENU Breakfast   cup orange juice   cup oatmeal  1 slice toast  1 tsp margarine  1 cup skim milk Lunch  [place] sandwich with 2 oz [place], 2 slices bread  Lettuce and tomato slices  Fresh fruit  Carrot sticks  Coffee or tea Snack  Fresh fruit or low-fat crackers Dinner  3 oz lean ground beef  1 baked potato  1 tsp margarine   cup asparagus  Lettuce salad  1 tbs non-creamy dressing   cup peach slices  1 cup skim milk Document Released: 11/25/2007 Document Revised: 08/17/2011 Document Reviewed: 04/17/2013 [employer] Patient Information 2015 [place], [place]. This information is not intended to replace advice given to you by your health care provider. Make sure you discuss any questions you have with your health care provider.

## 2014-08-05 ENCOUNTER — Telehealth: Payer: Self-pay | Admitting: Cardiology

## 2014-08-05 NOTE — Telephone Encounter (Signed)
Pt c/o BP issue:  1. What are your last 5 BP readings? June 1-6 Each morning between 7 and 8am  6/1-158/82 pulse 60 6/2-131/91 pulse 61 6/3-135/83 pulse 65 6/4-139/83 pulse 67 6/5-141/82 pulse 67 6/6-145/90 pulse 64  2. Are you having any other symptoms (ex. Dizziness, headache, blurred vision, passed out)? No symptoms  Comments: Pt states that he was instructed to call in with BP readings. Please call back to discuss

## 2014-08-05 NOTE — Telephone Encounter (Signed)
To Dr. Turner for review. 

## 2014-08-06 NOTE — Telephone Encounter (Signed)
BP ok continue current medical regimen

## 2014-08-07 NOTE — Telephone Encounter (Signed)
Informed patient his BP is OK and to continue current medications.  Patient agrees with treatment plan.

## 2014-08-11 ENCOUNTER — Other Ambulatory Visit: Payer: Self-pay | Admitting: Cardiology

## 2014-08-15 ENCOUNTER — Other Ambulatory Visit: Payer: Self-pay | Admitting: Cardiology

## 2014-08-20 ENCOUNTER — Other Ambulatory Visit: Payer: Self-pay | Admitting: Cardiology

## 2014-11-12 ENCOUNTER — Other Ambulatory Visit: Payer: Self-pay | Admitting: Cardiology

## 2015-01-01 ENCOUNTER — Telehealth: Payer: Self-pay | Admitting: Cardiology

## 2015-01-01 DIAGNOSIS — E7800 Pure hypercholesterolemia, unspecified: Secondary | ICD-10-CM

## 2015-01-01 NOTE — Telephone Encounter (Signed)
FLP and ALT 

## 2015-01-01 NOTE — Telephone Encounter (Signed)
Left message for patient to come fasting for lab work scheduled 12/2. Instructed patient to call back if there are further questions or concerns.

## 2015-01-01 NOTE — Telephone Encounter (Signed)
New Message  Pt called to sched lab work before Dec appt. No orders in epic. Please call back and discuss.

## 2015-01-30 NOTE — Addendum Note (Signed)
Addended by: Eulis Foster on: 01/30/2015 05:02 PM   Modules accepted: Orders

## 2015-01-31 ENCOUNTER — Other Ambulatory Visit (INDEPENDENT_AMBULATORY_CARE_PROVIDER_SITE_OTHER): Payer: Medicare Other | Admitting: *Deleted

## 2015-01-31 DIAGNOSIS — E7800 Pure hypercholesterolemia, unspecified: Secondary | ICD-10-CM | POA: Diagnosis not present

## 2015-01-31 LAB — LIPID PANEL
CHOL/HDL RATIO: 3.7 ratio (ref <=5.0)
CHOLESTEROL: 111 mg/dL — AB (ref 125–200)
HDL: 30 mg/dL — ABNORMAL LOW (ref >=40)
LDL Cholesterol: 62 mg/dL (ref <130)
TRIGLYCERIDES: 94 mg/dL (ref <150)
VLDL: 19 mg/dL (ref <30)

## 2015-01-31 LAB — ALT: ALT: 27 U/L (ref 9–46)

## 2015-01-31 NOTE — Addendum Note (Signed)
Addended by: Eulis Foster on: 01/31/2015 10:44 AM   Modules accepted: Orders

## 2015-02-05 ENCOUNTER — Encounter: Payer: Self-pay | Admitting: Cardiology

## 2015-02-05 ENCOUNTER — Ambulatory Visit (INDEPENDENT_AMBULATORY_CARE_PROVIDER_SITE_OTHER): Payer: Medicare Other | Admitting: Cardiology

## 2015-02-05 VITALS — BP 142/84 | HR 83 | Ht 69.5 in | Wt 205.0 lb

## 2015-02-05 DIAGNOSIS — E7800 Pure hypercholesterolemia, unspecified: Secondary | ICD-10-CM | POA: Diagnosis not present

## 2015-02-05 DIAGNOSIS — I2510 Atherosclerotic heart disease of native coronary artery without angina pectoris: Secondary | ICD-10-CM | POA: Diagnosis not present

## 2015-02-05 DIAGNOSIS — I208 Other forms of angina pectoris: Secondary | ICD-10-CM

## 2015-02-05 NOTE — Patient Instructions (Signed)
Medication Instructions:  Your physician recommends that you continue on your current medications as directed. Please refer to the Current Medication list given to you today.   Labwork: None  Testing/Procedures: Your physician has requested that you have a lexiscan myoview. For further information please visit [email or web address]. Please follow instruction sheet, as given.  Follow-Up: Your physician wants you to follow-up in: 1 year with Dr. Radford Pax. You will receive a reminder letter in the mail two months in advance. If you don't receive a letter, please call our office to schedule the follow-up appointment.   Any Other Special Instructions Will Be Listed Below (If Applicable).     If you need a refill on your cardiac medications before your next appointment, please call your pharmacy.

## 2015-02-05 NOTE — Progress Notes (Addendum)
Cardiology Office Note   Date:  02/05/2015   ID:  Nathan Ayala [birth year], MRN [id number]  PCP:  Nathan Roers, MD    Chief Complaint  Patient presents with  . Coronary Artery Disease  . Hyperlipidemia      History of Present Illness: Nathan Ayala is a 69 y.o. male with a history of ASCAD, chronic chest wall pain, dyslipidemia and HTN. He is doing well. He has chronic stable angina and occasionally takes a NTG but has never had to take more than 1. He says his anginal pain is very stable and has only taken NTG on 3-4 occasions due to chest tightness and SOB. He denies any  palpitations, dizziness or syncope. His LE edema is stable but does notice his rings will be swollen in the am if he has eaten out a lot.     Past Medical History  Diagnosis Date  . Coronary artery disease 08/2003    s/p inferior lateral myocardial infarction with PCI of Proximal RCA. He is also s/p PTCA stenting of Proximal LAD September 2006  . Atypical back pain     Chronic Secondary to musculoskeletal disease of cervical spine followed by Dr. Jannifer Franklin  . Atypical chest pain     Chronic Secondary to musculoskeletal disease of cervical spine followed by Dr. Jannifer Franklin  . Hyperlipidemia   . Stable angina (HCC)   . Benign essential HTN 07/30/2014    Past Surgical History  Procedure Laterality Date  . Cardiac catheterization    . Coronary angioplasty    . Arthroscopic knee surgery    . Repair of perforated ulcer    . Hernia repair    . Kidney stone surgery       Current Outpatient Prescriptions  Medication Sig Dispense Refill  . aspirin 81 MG tablet Take 81 mg by mouth daily.    . carisoprodol (SOMA) 350 MG tablet Take 350 mg by mouth as needed for muscle spasms.    . clopidogrel (PLAVIX) 75 MG tablet Take 75 mg by mouth daily.    . Coenzyme Q10 (COQ-10) 200 MG CAPS Take 1 capsule by mouth daily.    . isosorbide dinitrate (ISORDIL) 30 MG tablet Take 30 mg by mouth daily.    .  nitroGLYCERIN (NITROSTAT) 0.4 MG SL tablet Place 0.4 mg under the tongue every 5 (five) minutes as needed for chest pain.    . Omega-3 Fatty Acids (FISH OIL) 1000 MG CAPS Take 6 capsules (6,000 mg total) by mouth daily.    . pantoprazole (PROTONIX) 40 MG tablet Take 1 tablet (40 mg total) by mouth as needed. 90 tablet 0  . rosuvastatin (CRESTOR) 5 MG tablet Take 5 mg by mouth daily.    . vitamin B-12 (CYANOCOBALAMIN) 1000 MCG tablet Take 1,000 mcg by mouth daily.     No current facility-administered medications for this visit.    Allergies:   Review of patient's allergies indicates no known allergies.    Social History:  The patient  reports that he quit smoking about 44 years ago. His smoking use included Cigarettes. He does not have any smokeless tobacco history on file. He reports that he does not drink alcohol or use illicit drugs.   Family History:  The patient's family history includes Diabetes in his mother; Hypertension in his mother; Kidney cancer in his mother.    ROS:  Please see the history  of present illness.   Otherwise, review of systems are positive for none.   All other systems are reviewed and negative.    PHYSICAL EXAM: VS:  BP 142/84 mmHg  Pulse 83  Ht 5' 9.5" (1.765 m)  Wt 205 lb (92.987 kg)  BMI 29.85 kg/m2 , BMI Body mass index is 29.85 kg/(m^2). GEN: Well nourished, well developed, in no acute distress HEENT: normal Neck: no JVD, carotid bruits, or masses Cardiac: RRR; no murmurs, rubs, or gallops,no edema  Respiratory:  clear to auscultation bilaterally, normal work of breathing GI: soft, nontender, nondistended, + BS MS: no deformity or atrophy Skin: warm and dry, no rash Neuro:  Strength and sensation are intact Psych: euthymic mood, full affect   EKG:  EKG was ordered today and showed NSR with no ST changes    Recent Labs: 01/31/2015: ALT 27    Lipid Panel    Component Value Date/Time   CHOL 111* 01/31/2015 1044   TRIG 94 01/31/2015 1044    HDL 30* 01/31/2015 1044   CHOLHDL 3.7 01/31/2015 1044   VLDL 19 01/31/2015 1044   LDLCALC 62 01/31/2015 1044      Wt Readings from Last 3 Encounters:  02/05/15 205 lb (92.987 kg)  07/30/14 203 lb 1.9 oz (92.135 kg)  01/30/14 200 lb 6.4 oz (90.901 kg)    ASSESSMENT AND PLAN:  1. ASCAD with chronic stable angina but has had a few more episodes than the last time I saw him.   - check Lexiscan myoview to rule out ischemia since he has not had an ischemic w/u for several years.  - continue ASA/Plavix/Imdur/statin. 2. Asymptomatic bradycardia - resolved 3. Dyslipidemia - lipids are at goal - LDL was 62 01/2015  - continue Crestor  4. Chronic stable angina - on Imdur - see above   Current medicines are reviewed at length with the patient today.  The patient does not have concerns regarding medicines.  The following changes have been made:  no change  Labs/ tests ordered today: See above Assessment and Plan No orders of the defined types were placed in this encounter.     Disposition:   FU with me in 1 year  Signed, Sueanne Margarita, MD  02/05/2015 11:56 AM    [facility] [place], [place], [place]  [address] Phone: [phone]; Fax: [phone]

## 2015-02-05 NOTE — Addendum Note (Signed)
Addended by: Sueanne Margarita on: 02/05/2015 07:29 PM   Modules accepted: Miquel Dunn

## 2015-02-07 ENCOUNTER — Other Ambulatory Visit: Payer: Self-pay | Admitting: Cardiology

## 2015-02-09 ENCOUNTER — Other Ambulatory Visit: Payer: Self-pay | Admitting: Cardiology

## 2015-02-12 ENCOUNTER — Telehealth (HOSPITAL_COMMUNITY): Payer: Self-pay | Admitting: *Deleted

## 2015-02-12 NOTE — Telephone Encounter (Signed)
Patient given detailed instructions per Myocardial Perfusion Study Information Sheet for the test on 02/17/15 at 1000. Patient notified to arrive 15 minutes early and that it is imperative to arrive on time for appointment to keep from having the test rescheduled.  If you need to cancel or reschedule your appointment, please call the office within 24 hours of your appointment. Failure to do so may result in a cancellation of your appointment, and a $50 no show fee. Patient verbalized understanding.Hubbard Robinson, RN

## 2015-02-15 ENCOUNTER — Other Ambulatory Visit: Payer: Self-pay | Admitting: Cardiology

## 2015-02-17 ENCOUNTER — Ambulatory Visit (HOSPITAL_COMMUNITY): Payer: Medicare Other | Attending: Cardiology

## 2015-02-17 DIAGNOSIS — I208 Other forms of angina pectoris: Secondary | ICD-10-CM

## 2015-02-17 DIAGNOSIS — I10 Essential (primary) hypertension: Secondary | ICD-10-CM | POA: Insufficient documentation

## 2015-02-17 DIAGNOSIS — R0602 Shortness of breath: Secondary | ICD-10-CM | POA: Insufficient documentation

## 2015-02-17 LAB — MYOCARDIAL PERFUSION IMAGING
CHL CUP NUCLEAR SSS: 7
CHL CUP RESTING HR STRESS: 54 {beats}/min
CSEPPHR: 73 {beats}/min
LV dias vol: 70 mL
LVSYSVOL: 27 mL
NUC STRESS TID: 1.07
RATE: 0.34
SDS: 1
SRS: 6

## 2015-02-17 MED ORDER — TECHNETIUM TC 99M SESTAMIBI GENERIC - CARDIOLITE
32.8000 | Freq: Once | INTRAVENOUS | Status: AC | PRN
  Administered 2015-02-17: 32.8 via INTRAVENOUS

## 2015-02-17 MED ORDER — TECHNETIUM TC 99M SESTAMIBI GENERIC - CARDIOLITE
10.8000 | Freq: Once | INTRAVENOUS | Status: AC | PRN
  Administered 2015-02-17: 11 via INTRAVENOUS

## 2015-02-17 MED ORDER — REGADENOSON 0.4 MG/5ML IV SOLN
0.4000 mg | Freq: Once | INTRAVENOUS | Status: AC
  Administered 2015-02-17: 0.4 mg via INTRAVENOUS

## 2015-04-04 ENCOUNTER — Encounter: Payer: Self-pay | Admitting: Family Medicine

## 2015-04-04 ENCOUNTER — Ambulatory Visit (INDEPENDENT_AMBULATORY_CARE_PROVIDER_SITE_OTHER): Payer: Medicare Other | Admitting: Family Medicine

## 2015-04-04 VITALS — BP 142/90 | HR 94 | Temp 98.5°F | Ht 69.5 in | Wt 205.0 lb

## 2015-04-04 DIAGNOSIS — J101 Influenza due to other identified influenza virus with other respiratory manifestations: Secondary | ICD-10-CM

## 2015-04-04 DIAGNOSIS — R059 Cough, unspecified: Secondary | ICD-10-CM

## 2015-04-04 DIAGNOSIS — R05 Cough: Secondary | ICD-10-CM | POA: Diagnosis not present

## 2015-04-04 LAB — POCT INFLUENZA A/B
INFLUENZA B, POC: NEGATIVE
Influenza A, POC: POSITIVE — AB

## 2015-04-04 MED ORDER — HYDROCODONE-HOMATROPINE 5-1.5 MG/5ML PO SYRP: 5.0000 mL | ORAL_SOLUTION | Freq: Four times a day (QID) | ORAL | Status: DC | PRN

## 2015-04-04 MED ORDER — OSELTAMIVIR PHOSPHATE 75 MG PO CAPS: 75.0000 mg | ORAL_CAPSULE | Freq: Two times a day (BID) | ORAL | Status: DC

## 2015-04-04 NOTE — Progress Notes (Signed)
Subjective:  Patient ID: Nathan Ayala, male    DOB: 05/27/1945  Age: 70 y.o. MRN: [id number]  CC: Cough; Fever; and Nasal Congestion   HPI Nathan Ayala presents for Patient presents with upper respiratory congestion. Rhinorrhea that iscopious but clear. There is no sore throat. Patient reports coughing frequently as well. yellow-colored/purulent sputum noted. There is subjective fever with  chills no sweats. The patient denies being short of breath. Onset was 3 days ago. Gradually worsening.   History Nathan Ayala has a past medical history of Coronary artery disease (08/2003); Atypical back pain; Atypical chest pain; Hyperlipidemia; Stable angina (HCC); and Benign essential HTN (07/30/2014).   He has past surgical history that includes Cardiac catheterization; Coronary angioplasty; arthroscopic knee surgery; Repair of perforated ulcer; Hernia repair; and Kidney stone surgery.   His family history includes Diabetes in his mother; Hypertension in his mother; Kidney cancer in his mother.He reports that he quit smoking about 45 years ago. His smoking use included Cigarettes. He does not have any smokeless tobacco history on file. He reports that he does not drink alcohol or use illicit drugs.  Current Outpatient Prescriptions on File Prior to Visit  Medication Sig Dispense Refill  . aspirin 81 MG tablet Take 81 mg by mouth daily.    . clopidogrel (PLAVIX) 75 MG tablet TAKE 1 TABLET DAILY 90 tablet 3  . Coenzyme Q10 (COQ-10) 200 MG CAPS Take 1 capsule by mouth daily.    . CRESTOR 5 MG tablet TAKE 1 TABLET DAILY 90 tablet 3  . isosorbide dinitrate (ISORDIL) 30 MG tablet Take 30 mg by mouth daily.    . isosorbide mononitrate (IMDUR) 30 MG 24 hr tablet TAKE 1 TABLET DAILY 90 tablet 3  . nitroGLYCERIN (NITROSTAT) 0.4 MG SL tablet Place 0.4 mg under the tongue every 5 (five) minutes as needed for chest pain.    . Omega-3 Fatty Acids (FISH OIL) 1000 MG CAPS Take 6 capsules (6,000 mg total) by mouth  daily.    . pantoprazole (PROTONIX) 40 MG tablet Take 1 tablet (40 mg total) by mouth as needed. 90 tablet 3  . vitamin B-12 (CYANOCOBALAMIN) 1000 MCG tablet Take 1,000 mcg by mouth daily.     No current facility-administered medications on file prior to visit.    ROS Review of Systems  Constitutional: Positive for fever. Negative for chills, activity change and appetite change.  HENT: Positive for congestion and rhinorrhea. Negative for ear discharge, ear pain, hearing loss, nosebleeds, postnasal drip, sinus pressure, sneezing and trouble swallowing.   Respiratory: Positive for cough. Negative for chest tightness and shortness of breath.   Cardiovascular: Negative for chest pain and palpitations.  Skin: Negative for rash.    Objective:  BP 142/90 mmHg  Pulse 94  Temp(Src) 98.5 F (36.9 C) (Oral)  Ht 5' 9.5" (1.765 m)  Wt 205 lb (92.987 kg)  BMI 29.85 kg/m2  Physical Exam  Constitutional: He is oriented to person, place, and time. He appears well-developed and well-nourished. No distress.  HENT:  Head: Normocephalic and atraumatic.  Right Ear: External ear normal.  Left Ear: External ear normal.  Nose: Nose normal.  Mouth/Throat: Oropharynx is clear and moist.  Eyes: Conjunctivae and EOM are normal. Pupils are equal, round, and reactive to light.  Neck: Normal range of motion. Neck supple. No thyromegaly present.  Cardiovascular: Normal rate, regular rhythm and normal heart sounds.   No murmur heard. Pulmonary/Chest: Effort normal and breath sounds normal. No respiratory distress. He has  no wheezes. He has no rales.  Abdominal: Soft. Bowel sounds are normal. He exhibits no distension. There is no tenderness.  Lymphadenopathy:    He has no cervical adenopathy.  Neurological: He is alert and oriented to person, place, and time. He has normal reflexes.  Skin: Skin is warm and dry.  Psychiatric: He has a normal mood and affect. His behavior is normal. Judgment and thought  content normal.   Results for orders placed or performed in visit on 04/04/15  POCT Influenza A/B  Result Value Ref Range   Influenza A, POC Positive (A) Negative   Influenza B, POC Negative Negative   Assessment & Plan:   Nathan Ayala was seen today for cough, fever and nasal congestion.  Diagnoses and all orders for this visit:  Cough -     POCT Influenza A/B  Influenza A  Other orders -     oseltamivir (TAMIFLU) 75 MG capsule; Take 1 capsule (75 mg total) by mouth 2 (two) times daily. -     HYDROcodone-homatropine (HYCODAN) 5-1.5 MG/5ML syrup; Take 5 mLs by mouth every 6 (six) hours as needed for cough.   I have discontinued Mr. Everts carisoprodol and rosuvastatin. I am also having him start on oseltamivir and HYDROcodone-homatropine. Additionally, I am having him maintain his vitamin B-12, aspirin, CoQ-10, nitroGLYCERIN, Fish Oil, isosorbide dinitrate, isosorbide mononitrate, pantoprazole, CRESTOR, and clopidogrel.  Meds ordered this encounter  Medications  . oseltamivir (TAMIFLU) 75 MG capsule    Sig: Take 1 capsule (75 mg total) by mouth 2 (two) times daily.    Dispense:  10 capsule    Refill:  0  . HYDROcodone-homatropine (HYCODAN) 5-1.5 MG/5ML syrup    Sig: Take 5 mLs by mouth every 6 (six) hours as needed for cough.    Dispense:  120 mL    Refill:  0     Follow-up: Return if symptoms worsen or fail to improve.  Claretta Fraise, M.D.

## 2015-08-11 ENCOUNTER — Encounter: Payer: Self-pay | Admitting: Physician Assistant

## 2015-08-11 ENCOUNTER — Encounter: Payer: Self-pay | Admitting: Cardiology

## 2015-08-18 ENCOUNTER — Telehealth: Payer: Self-pay | Admitting: Cardiology

## 2015-08-18 ENCOUNTER — Other Ambulatory Visit: Payer: Self-pay

## 2015-08-18 NOTE — Telephone Encounter (Signed)
New Message:  Pt called in stating that last Friday into early Saturday he experienced some pain in his left arm, tingling in his fingers, and burning sensation in his left foot. He took a NTG and that helped to subside the symptoms. Please f/u with the pt.

## 2015-08-18 NOTE — Telephone Encounter (Signed)
Patient st he was driving his tractor last Friday when he got a dull "hurting sensation" in his L upper arm, tingling in his L fingers, and a "burning feeling" in his L foot.  These sensations were intermittent Friday and Saturday. He took a Nitro Saturday afternoon and the symptoms resolved. He has not had any symptoms since and currently feels well.  He st that it does not feel the same as when he had his heart attack, but he is anxious about the feelings. He confirms he is taking his medications as instructed.  Scheduled patient Monday, June 26 for evaluation with Estella Husk, PA. Patient understands to seek medical attention immediately if symptoms worsen prior to that time. He was grateful for call.

## 2015-08-18 NOTE — Telephone Encounter (Signed)
Sueanne Margarita, MD at 02/05/2015 11:48 AM  nitroGLYCERIN (NITROSTAT) 0.4 MG SL tabletPlace 0.4 mg under the tongue every 5 (five) minutes as needed for chest pain Current medicines are reviewed at length with the patient today. The patient does not have concerns regarding medicines.  The following changes have been made: no change  Patient Instructions     Medication Instructions:  Your physician recommends that you continue on your current medications as directed. Please refer to the Current Medication list given to you today

## 2015-08-20 MED ORDER — NITROGLYCERIN 0.4 MG SL SUBL: 0.4000 mg | SUBLINGUAL_TABLET | SUBLINGUAL | Status: DC | PRN

## 2015-08-25 ENCOUNTER — Encounter: Payer: Self-pay | Admitting: Physician Assistant

## 2015-08-25 ENCOUNTER — Ambulatory Visit (INDEPENDENT_AMBULATORY_CARE_PROVIDER_SITE_OTHER): Payer: Medicare Other | Admitting: Physician Assistant

## 2015-08-25 VITALS — BP 136/86 | HR 65 | Ht 69.5 in | Wt 205.4 lb

## 2015-08-25 DIAGNOSIS — I2510 Atherosclerotic heart disease of native coronary artery without angina pectoris: Secondary | ICD-10-CM

## 2015-08-25 DIAGNOSIS — M79602 Pain in left arm: Secondary | ICD-10-CM

## 2015-08-25 DIAGNOSIS — I10 Essential (primary) hypertension: Secondary | ICD-10-CM | POA: Diagnosis not present

## 2015-08-25 MED ORDER — ISOSORBIDE MONONITRATE ER 30 MG PO TB24: 30.0000 mg | ORAL_TABLET | Freq: Two times a day (BID) | ORAL | Status: DC

## 2015-08-25 MED ORDER — CRESTOR 5 MG PO TABS: 5.0000 mg | ORAL_TABLET | Freq: Every day | ORAL | Status: DC

## 2015-08-25 NOTE — Progress Notes (Signed)
Cardiology Office Note    Date:  08/25/2015   ID:  Nathan Ayala 1946/02/21, MRN [id number]  PCP:  Nathan Roers, MD  Cardiologist: Dr. Radford Ayala  Chief complaint chest pain  History of Present Illness:  Nathan Ayala is a 70 y.o. male  with a history of ASCAD, chronic chest wall pain, dyslipidemia and HTN. He is doing well. He has chronic left shoulder into his left arm and down his back  History of MI with PCI of the proximal RCA in 2005 and PTCA of the proximal LAD in 2006.  Now complains of Tingling, burning sensation in left foot, worse when driving tractor, pain in left shoulder associated with tingling in all fingers of left arm eases with NTG, tingling lips.Been going on for over a week. All these sensations occur at that same time.  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 Has noticed these symptoms mowing but not weed eating or walking.  Brings blood work in from Dr. Tamsen Ayala. Bilirubin was high but it's always high with his skilled Wert syndrome. Cholesterol is stable at 107 vitamin B-12 and hemoglobin A1c were normal testosterone vitamin D PSA and CBC were all stable.  Past Medical History  Diagnosis Date  . Coronary artery disease 08/2003    s/p inferior lateral myocardial infarction with PCI of Proximal RCA. He is also s/p PTCA stenting of Proximal LAD September 2006  . Atypical back pain     Chronic Secondary to musculoskeletal disease of cervical spine followed by Dr. Jannifer Ayala  . Atypical chest pain     Chronic Secondary to musculoskeletal disease of cervical spine followed by Dr. Jannifer Ayala  . Hyperlipidemia   . Stable angina (HCC)   . Benign essential HTN 07/30/2014    Past Surgical History  Procedure Laterality Date  . Cardiac catheterization    . Coronary angioplasty    . Arthroscopic knee surgery    . Repair of perforated ulcer    . Hernia repair    . Kidney stone surgery      Current Medications: Outpatient  Prescriptions Prior to Visit  Medication Sig Dispense Refill  . aspirin 81 MG tablet Take 81 mg by mouth daily.    . clopidogrel (PLAVIX) 75 MG tablet TAKE 1 TABLET DAILY 90 tablet 3  . Coenzyme Q10 (COQ-10) 200 MG CAPS Take 1 capsule by mouth daily.    . nitroGLYCERIN (NITROSTAT) 0.4 MG SL tablet Place 1 tablet (0.4 mg total) under the tongue every 5 (five) minutes as needed for chest pain. 25 tablet 1  . Omega-3 Fatty Acids (FISH OIL) 1000 MG CAPS Take 6 capsules (6,000 mg total) by mouth daily.    . pantoprazole (PROTONIX) 40 MG tablet Take 1 tablet (40 mg total) by mouth as needed. 90 tablet 3  . vitamin B-12 (CYANOCOBALAMIN) 1000 MCG tablet Take 1,000 mcg by mouth daily.    . CRESTOR 5 MG tablet TAKE 1 TABLET DAILY 90 tablet 3  . isosorbide mononitrate (IMDUR) 30 MG 24 hr tablet TAKE 1 TABLET DAILY 90 tablet 3  . HYDROcodone-homatropine (HYCODAN) 5-1.5 MG/5ML syrup Take 5 mLs by mouth every 6 (six) hours as needed for cough. (Patient not taking: Reported on 08/25/2015) 120 mL 0  . oseltamivir (TAMIFLU) 75 MG capsule Take 1 capsule (75 mg total) by mouth 2 (two) times daily. (Patient not taking: Reported on 08/25/2015) 10 capsule 0   No facility-administered medications prior to visit.  Allergies:   Review of patient's allergies indicates no known allergies.   Social History   Social History  . Marital Status: Married    Spouse Name: N/A  . Number of Children: N/A  . Years of Education: N/A   Social History Main Topics  . Smoking status: Former Smoker    Types: Cigarettes    Quit date: 03/01/1970  . Smokeless tobacco: Never Used  . Alcohol Use: No  . Drug Use: No  . Sexual Activity: Not Asked   Other Topics Concern  . None   Social History Narrative     Family History:  The patient's family history includes Diabetes in his mother; Hypertension in his mother; Kidney cancer in his mother.   ROS:   Please see the history of present illness.    Review of Systems    Musculoskeletal: Positive for myalgias.   All other systems reviewed and are negative.   PHYSICAL EXAM:   VS:  BP 136/86 mmHg  Pulse 65  Ht 5' 9.5" (1.765 m)  Wt 205 lb 6.4 oz (93.169 kg)  BMI 29.91 kg/m2  Physical Exam  GEN: Well nourished, well developed, in no acute distress Neck: no JVD, carotid bruits, or masses Cardiac:RRR; no murmurs, rubs, or gallops  Respiratory:  clear to auscultation bilaterally, normal work of breathing GI: soft, nontender, nondistended, + BS Ext: without cyanosis, clubbing, or edema, Good distal pulses bilaterally MS: no deformity or atrophy Skin: warm and dry, no rash Neuro:  Alert and Oriented x 3, Strength and sensation are intact Psych: euthymic mood, full affect  Wt Readings from Last 3 Encounters:  08/25/15 205 lb 6.4 oz (93.169 kg)  04/04/15 205 lb (92.987 kg)  02/17/15 205 lb (92.987 kg)      Studies/Labs Reviewed:   EKG:  EKG is ordered today.  The ekg ordered today demonstrates Normal sinus rhythm, normal EKG  Recent Labs: 01/31/2015: ALT 27   Lipid Panel    Component Value Date/Time   CHOL 111* 01/31/2015 1044   TRIG 94 01/31/2015 1044   HDL 30* 01/31/2015 1044   CHOLHDL 3.7 01/31/2015 1044   VLDL 19 01/31/2015 1044   LDLCALC 62 01/31/2015 1044    Additional studies/ records that were reviewed today include:  Nuclear stress test: 01/2015: Study Highlights      Nuclear stress EF: 62%.  There was no ST segment deviation noted during stress.  The study is normal.  This is a low risk study.  The left ventricular ejection fraction is normal (55-65%).   Normal perfusion EF 62%         ASSESSMENT:    1. Atherosclerosis of native coronary artery of native heart, angina presence unspecified   2. Left arm pain   3. Essential hypertension      PLAN:  In order of problems listed above:  History of CAD with previous MI and stents complaining of recurrent left arm pain which seems to be his anginal equivalent  relieved with sublingual nitroglycerin. Recommend nuclear stress test.  Hypertension controlled    Medication Adjustments/Labs and Tests Ordered: Current medicines are reviewed at length with the patient today.  Concerns regarding medicines are outlined above.  Medication changes, Labs and Tests ordered today are listed in the Patient Instructions below. Patient Instructions  Medication Instructions:  INCREASE YOUR ISOSORBIDE TO 30 MG 2 DAILY    Labwork: NONE  Testing/Procedures: NONE  Follow-Up: Your physician recommends that you schedule a follow-up appointment in: DR [facility] 1-2  MONTHS  Any Other Special Instructions Will Be Listed Below (If Applicable). SENT IN RX FOR NAME BRAND CRESTOR   CALL BACK IF ANY RECURRENT SYMPTOMS  If you need a refill on your cardiac medications before your next appointment, please call your pharmacy.      Sumner Boast, PA-C  08/25/2015 3:25 PM    [facility] [address], [place], [place]  [address] Phone: [phone]; Fax: [phone]

## 2015-08-25 NOTE — Patient Instructions (Addendum)
Medication Instructions:  INCREASE YOUR ISOSORBIDE TO 30 MG 2 DAILY    Labwork: NONE  Testing/Procedures: NONE  Follow-Up: Your physician recommends that you schedule a follow-up appointment in: DR [facility] 1-2 MONTHS  Any Other Special Instructions Will Be Listed Below (If Applicable). SENT IN RX FOR NAME BRAND CRESTOR   CALL BACK IF ANY RECURRENT SYMPTOMS  If you need a refill on your cardiac medications before your next appointment, please call your pharmacy.

## 2015-08-27 ENCOUNTER — Telehealth: Payer: Self-pay | Admitting: Cardiology

## 2015-08-27 ENCOUNTER — Other Ambulatory Visit: Payer: Self-pay | Admitting: Physician Assistant

## 2015-08-27 ENCOUNTER — Telehealth: Payer: Self-pay | Admitting: Physician Assistant

## 2015-08-27 MED ORDER — ROSUVASTATIN CALCIUM 5 MG PO TABS: 5.0000 mg | ORAL_TABLET | Freq: Every day | ORAL | Status: DC

## 2015-08-27 NOTE — Telephone Encounter (Signed)
Pt calling stating that he needs a prior auth on Crestor 5 mg tab, pt states that he does not want generic. Please advise

## 2015-08-27 NOTE — Telephone Encounter (Signed)
Left message to call back  

## 2015-08-27 NOTE — Telephone Encounter (Signed)
New message      Pt c/o medication issue:  1. Name of Medication: generic of Crestor  2. How are you currently taking this medication (dosage and times per day)? 5 mg po daily 3. Are you having a reaction (difficulty breathing--STAT)? no  4. What is your medication issue? Th pt can not take a generic brand-the [facility] and need [facility] not to have to go the generic and stay on the Crestor   The pt states the pharmacy sent to email

## 2015-08-28 NOTE — Telephone Encounter (Signed)
Left message with wife for patient to call back with [employer] Info.

## 2015-08-28 NOTE — Telephone Encounter (Signed)
Follow Up:; ° ° °Returning your call. °

## 2015-08-28 NOTE — Telephone Encounter (Signed)
Patient states he would like to stay on name brand Crestor because he has been on it for a long time with no problems.  He was told by [facility] a PA would have to be done for it to be covered.  Informed him a message would be sent to the PA nurse to complete. He was grateful for call.

## 2015-09-01 NOTE — Telephone Encounter (Signed)
Prior auth for Crestor 5mg  faxed to [facility]

## 2015-09-01 NOTE — Telephone Encounter (Signed)
I sent a from back to [facility] noting Brand Name Crestor is requested. Also attempted to do a PA, but Cover [facility] noted " PA not Needed."

## 2015-09-03 ENCOUNTER — Telehealth: Payer: Self-pay

## 2015-09-03 NOTE — Telephone Encounter (Signed)
Spoke with [employer] Rep to do a PA for name Brand Crestor 5mg . They stated it does not meet medical criteria for approval. States generic Crestor meets FDA standards Case ID is [id number]. Patient will receive a letter to this affect also.

## 2015-09-08 ENCOUNTER — Telehealth: Payer: Self-pay | Admitting: Cardiology

## 2015-09-08 NOTE — Telephone Encounter (Signed)
Spoke with patient today. Explained that [employer] has denied  Brand Crestor. Told him that we could appeal, and he would like to proceed with that. Mailed him form to sign to appoint me as his representative.

## 2015-09-08 NOTE — Telephone Encounter (Signed)
New Message:   [facility] have cancelled his prescription for Crestor. They said nobody from her got back to [facility],so prescription was cancelled.

## 2015-09-16 ENCOUNTER — Telehealth: Payer: Self-pay | Admitting: Cardiology

## 2015-09-16 NOTE — Telephone Encounter (Signed)
New message      Calling to see if he is to send the appeal forms back to [place] or send it to [employer].  OK to leave msg on vm.

## 2015-09-23 NOTE — Telephone Encounter (Signed)
Spoke with patient about this appeal for Crestor. He is bringing in his part of the form, signed and dated. I will send it to [employer].

## 2015-09-28 ENCOUNTER — Emergency Department (HOSPITAL_COMMUNITY): Payer: Medicare Other

## 2015-09-28 ENCOUNTER — Emergency Department (HOSPITAL_COMMUNITY)
Admission: EM | Admit: 2015-09-28 | Discharge: 2015-09-29 | Disposition: A | Discharge status: Home / Self Care | Payer: Medicare Other | Attending: Emergency Medicine | Admitting: Emergency Medicine

## 2015-09-28 ENCOUNTER — Encounter (HOSPITAL_COMMUNITY): Payer: Self-pay

## 2015-09-28 DIAGNOSIS — Z87891 Personal history of nicotine dependence: Secondary | ICD-10-CM | POA: Insufficient documentation

## 2015-09-28 DIAGNOSIS — Z955 Presence of coronary angioplasty implant and graft: Secondary | ICD-10-CM | POA: Insufficient documentation

## 2015-09-28 DIAGNOSIS — R0789 Other chest pain: Secondary | ICD-10-CM | POA: Insufficient documentation

## 2015-09-28 DIAGNOSIS — Z79899 Other long term (current) drug therapy: Secondary | ICD-10-CM | POA: Insufficient documentation

## 2015-09-28 DIAGNOSIS — Z7982 Long term (current) use of aspirin: Secondary | ICD-10-CM | POA: Insufficient documentation

## 2015-09-28 DIAGNOSIS — R079 Chest pain, unspecified: Secondary | ICD-10-CM | POA: Diagnosis present

## 2015-09-28 DIAGNOSIS — I2510 Atherosclerotic heart disease of native coronary artery without angina pectoris: Secondary | ICD-10-CM | POA: Insufficient documentation

## 2015-09-28 DIAGNOSIS — I10 Essential (primary) hypertension: Secondary | ICD-10-CM | POA: Insufficient documentation

## 2015-09-28 LAB — CBC
HEMATOCRIT: 51.6 % (ref 39.0–52.0)
Hemoglobin: 17.2 g/dL — ABNORMAL HIGH (ref 13.0–17.0)
MCH: 29.9 pg (ref 26.0–34.0)
MCHC: 33.3 g/dL (ref 30.0–36.0)
MCV: 89.6 fL (ref 78.0–100.0)
Platelets: 195 10*3/uL (ref 150–400)
RBC: 5.76 MIL/uL (ref 4.22–5.81)
RDW: 13.1 % (ref 11.5–15.5)
WBC: 7.4 10*3/uL (ref 4.0–10.5)

## 2015-09-28 LAB — BASIC METABOLIC PANEL
Anion gap: 9 (ref 5–15)
BUN: 18 mg/dL (ref 6–20)
CHLORIDE: 105 mmol/L (ref 101–111)
CO2: 24 mmol/L (ref 22–32)
Calcium: 9.4 mg/dL (ref 8.9–10.3)
Creatinine, Ser: 1.44 mg/dL — ABNORMAL HIGH (ref 0.61–1.24)
GFR calc Af Amer: 55 mL/min — ABNORMAL LOW (ref >60)
GFR calc non Af Amer: 47 mL/min — ABNORMAL LOW (ref >60)
GLUCOSE: 99 mg/dL (ref 65–99)
POTASSIUM: 4.3 mmol/L (ref 3.5–5.1)
Sodium: 138 mmol/L (ref 135–145)

## 2015-09-28 LAB — I-STAT TROPONIN, ED: Troponin i, poc: 0.01 ng/mL (ref 0.00–0.08)

## 2015-09-28 MED ORDER — SODIUM CHLORIDE 0.9 % IV SOLN
Freq: Once | INTRAVENOUS | Status: AC
  Administered 2015-09-28: 20:00:00 via INTRAVENOUS

## 2015-09-28 MED ORDER — IOPAMIDOL (ISOVUE-370) INJECTION 76%
INTRAVENOUS | Status: AC
  Administered 2015-09-28: 100 mL
  Filled 2015-09-28: qty 100

## 2015-09-28 MED ORDER — SODIUM CHLORIDE 0.9 % IV BOLUS (SEPSIS)
1000.0000 mL | Freq: Once | INTRAVENOUS | Status: AC
  Administered 2015-09-28: 1000 mL via INTRAVENOUS

## 2015-09-28 MED ORDER — MORPHINE SULFATE (PF) 4 MG/ML IV SOLN: 4.0000 mg | INTRAVENOUS | Status: DC | PRN

## 2015-09-28 MED ORDER — ONDANSETRON HCL 4 MG/2ML IJ SOLN
4.0000 mg | Freq: Once | INTRAMUSCULAR | Status: AC
  Administered 2015-09-28: 4 mg via INTRAVENOUS
  Filled 2015-09-28: qty 2

## 2015-09-28 MED ORDER — METHOCARBAMOL 1000 MG/10ML IJ SOLN
1000.0000 mg | Freq: Once | INTRAMUSCULAR | Status: AC
  Administered 2015-09-28: 1000 mg via INTRAVENOUS
  Filled 2015-09-28: qty 10

## 2015-09-28 NOTE — ED Triage Notes (Signed)
Pt complaining of R sided chest pain and tightness radiating to R arm and shoulder blade. Pt states took 2 nitro, pain has improved.

## 2015-09-28 NOTE — ED Notes (Signed)
Phlebotomy at bedside at this time.

## 2015-09-28 NOTE — ED Notes (Signed)
Patient transported to CT 

## 2015-09-28 NOTE — ED Notes (Signed)
Patient declined MS 4 mg for pain relief/management.

## 2015-09-29 DIAGNOSIS — R0789 Other chest pain: Secondary | ICD-10-CM | POA: Diagnosis not present

## 2015-09-29 LAB — I-STAT TROPONIN, ED: TROPONIN I, POC: 0 ng/mL (ref 0.00–0.08)

## 2015-09-29 MED ORDER — METHOCARBAMOL 500 MG PO TABS: 500.0000 mg | ORAL_TABLET | Freq: Two times a day (BID) | ORAL | 0 refills | Status: DC

## 2015-09-29 MED ORDER — HYDROCODONE-ACETAMINOPHEN 5-325 MG PO TABS: 2.0000 | ORAL_TABLET | ORAL | 0 refills | Status: DC | PRN

## 2015-09-29 MED ORDER — NAPROXEN 500 MG PO TABS: 500.0000 mg | ORAL_TABLET | Freq: Two times a day (BID) | ORAL | 0 refills | Status: DC

## 2015-09-29 NOTE — Discharge Instructions (Signed)
Follow-up with your primary are physician if her symptoms persist more than the next few days.  Present back to the emergency room with any left-sided chest pain shortness of breath or new or worsening symptoms.

## 2015-09-29 NOTE — ED Provider Notes (Signed)
[facility] DEPT Provider Note   CSN: [id number] Arrival date & time: 09/28/15  [id number]  First Provider Contact:  First MD Initiated Contact with Patient 09/28/15 1809        History   Chief Complaint Chief Complaint  Patient presents with  . Chest Pain    HPI Nathan Ayala is a 70 y.o. male.  With history of coronary artery disease. He presents with  chest pain for the last 8 hours. 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 He was carrying some flags up a few flights of stairs this morning does not recall feeling any pain or symptoms or discomfort when he did so. 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 During the middle the sermon he noticed when he moved his right arm he had some pain under his right axilla and into his scapula on the right. Is now in somewhat into his right anterior chest. History of angina states this feels nothing like his previous episodes of anginal pain. He took 2 nitroglycerin and states that he really doesn't feel Zometa appreciable difference in his symptoms. He did not feel short of breath was not nauseated dizzy or lightheaded area no transient weakness of any extremities. No anterior left-sided chest pain.  HPI  Past Medical History:  Diagnosis Date  . Atypical back pain    Chronic Secondary to musculoskeletal disease of cervical spine followed by Dr. Jannifer Franklin  . Atypical chest pain    Chronic Secondary to musculoskeletal disease of cervical spine followed by Dr. Jannifer Franklin  . Benign essential HTN 07/30/2014  . Coronary artery disease 08/2003   s/p inferior lateral myocardial infarction with PCI of Proximal RCA. He is also s/p PTCA stenting of Proximal LAD September 2006  . Hyperlipidemia   . Stable angina [facility])     Patient Active Problem List   Diagnosis Date Noted  . Left arm pain 08/25/2015  . Bradycardia 02/07/2013  . Stable angina (HCC)   . Coronary atherosclerosis of native coronary artery 02/02/2013  . Pure hypercholesterolemia  02/02/2013  . Encounter for long-term (current) use of other medications 02/02/2013    Past Surgical History:  Procedure Laterality Date  . arthroscopic knee surgery    . CARDIAC CATHETERIZATION    . CORONARY ANGIOPLASTY    . HERNIA REPAIR    . KIDNEY STONE SURGERY    . REPAIR OF PERFORATED ULCER         Home Medications    Prior to Admission medications   Medication Sig Start Date End Date Taking? Authorizing Provider  aspirin 81 MG tablet Take 81 mg by mouth daily.   Yes Historical Provider, MD  Ca Carbonate-Mag Hydroxide (ROLAIDS) 550-110 MG CHEW Chew 1 each by mouth daily as needed (for heartburn).   Yes Historical Provider, MD  calcium carbonate (TUMS - DOSED IN MG ELEMENTAL CALCIUM) 500 MG chewable tablet Chew 1 tablet by mouth daily as needed for indigestion or heartburn.   Yes Historical Provider, MD  clopidogrel (PLAVIX) 75 MG tablet TAKE 1 TABLET DAILY 02/17/15  Yes Sueanne Margarita, MD  Coenzyme Q10 (COQ-10) 200 MG CAPS Take 1 capsule by mouth daily.   Yes Historical Provider, MD  isosorbide mononitrate (IMDUR) 30 MG 24 hr tablet Take 1 tablet (30 mg total) by mouth 2 (two) times daily. Patient taking differently: Take 60 mg by mouth daily with lunch.  08/25/15  Yes Imogene Burn, PA-C  nitroGLYCERIN (NITROSTAT) 0.4 MG SL tablet Place  1 tablet (0.4 mg total) under the tongue every 5 (five) minutes as needed for chest pain. 08/20/15  Yes Sueanne Margarita, MD  Omega 3 1200 MG CAPS Take 1,200-2,400 mg by mouth 3 (three) times daily. Takes 2 caps in am, 1 cap at lunch, 2 caps in evening   Yes Historical Provider, MD  rosuvastatin (CRESTOR) 5 MG tablet Take 1 tablet (5 mg total) by mouth daily. DAW Patient taking differently: Take 5 mg by mouth every evening. DAW 08/27/15  Yes Imogene Burn, PA-C  vitamin B-12 (CYANOCOBALAMIN) 1000 MCG tablet Take 1,000 mcg by mouth daily.   Yes Historical Provider, MD  HYDROcodone-acetaminophen (NORCO/VICODIN) 5-325 MG tablet Take 2 tablets by  mouth every 4 (four) hours as needed. 09/29/15   Tanna Furry, MD  methocarbamol (ROBAXIN) 500 MG tablet Take 1 tablet (500 mg total) by mouth 2 (two) times daily. 09/29/15   Tanna Furry, MD  naproxen (NAPROSYN) 500 MG tablet Take 1 tablet (500 mg total) by mouth 2 (two) times daily. 09/29/15   Tanna Furry, MD  Omega-3 Fatty Acids (FISH OIL) 1000 MG CAPS Take 6 capsules (6,000 mg total) by mouth daily. Patient not taking: Reported on 09/28/2015 08/02/13   Sueanne Margarita, MD  pantoprazole (PROTONIX) 40 MG tablet Take 1 tablet (40 mg total) by mouth as needed. Patient not taking: Reported on 09/28/2015 02/10/15   Sueanne Margarita, MD    Family History Family History  Problem Relation Age of Onset  . Kidney cancer Mother   . Hypertension Mother   . Diabetes Mother     Social History Social History  Substance Use Topics  . Smoking status: Former Smoker    Types: Cigarettes    Quit date: 03/01/1970  . Smokeless tobacco: Never Used  . Alcohol use No     Allergies   Review of patient's allergies indicates no known allergies.   Review of Systems Review of Systems  Constitutional: Negative for appetite change, chills, diaphoresis, fatigue and fever.  HENT: Negative for mouth sores, sore throat and trouble swallowing.   Eyes: Negative for visual disturbance.  Respiratory: Negative for cough, chest tightness, shortness of breath and wheezing.   Cardiovascular: Positive for chest pain.  Gastrointestinal: Negative for abdominal distention, abdominal pain, diarrhea, nausea and vomiting.  Endocrine: Negative for polydipsia, polyphagia and polyuria.  Genitourinary: Negative for dysuria, frequency and hematuria.  Musculoskeletal: Negative for gait problem.       Right axilla, right chest, and right scapular pain.  Skin: Negative for color change, pallor and rash.  Neurological: Negative for dizziness, syncope, light-headedness and headaches.  Hematological: Does not bruise/bleed easily.    Psychiatric/Behavioral: Negative for behavioral problems and confusion.     Physical Exam Updated Vital Signs BP 107/74   Pulse 64   Temp 98 F (36.7 C) (Oral)   Resp 18   Ht 5\' 9"  (1.753 m)   Wt 198 lb (89.8 kg)   SpO2 94%   BMI 29.24 kg/m   Physical Exam  Constitutional: He is oriented to person, place, and time. He appears well-developed and well-nourished. No distress.  HENT:  Head: Normocephalic.  Eyes: Conjunctivae are normal. Pupils are equal, round, and reactive to light. No scleral icterus.  Neck: Normal range of motion. Neck supple. No thyromegaly present.  Cardiovascular: Normal rate and regular rhythm.  Exam reveals no gallop and no friction rub.   No murmur heard. Normal heart tones. Normal pulses to the carotids, radial, femoral, DP pulses.  Normal range of motion and use extremity is without deficits.  Pulmonary/Chest: Effort normal and breath sounds normal. No respiratory distress. He has no wheezes. He has no rales.  Abdominal: Soft. Bowel sounds are normal. He exhibits no distension. There is no tenderness. There is no rebound.  Musculoskeletal: Normal range of motion.  Elevating his right arm does somewhat reproduces symptoms in his right posterior axilla along his latissimus. Some periscapular tenderness as well.  Neurological: He is alert and oriented to person, place, and time.  Skin: Skin is warm and dry. No rash noted.  Psychiatric: He has a normal mood and affect. His behavior is normal.     ED Treatments / Results  Labs (all labs ordered are listed, but only abnormal results are displayed) Labs Reviewed  BASIC METABOLIC PANEL - Abnormal; Notable for the following:       Result Value   Creatinine, Ser 1.44 (*)    GFR calc non Af Amer 47 (*)    GFR calc Af Amer 55 (*)    All other components within normal limits  CBC - Abnormal; Notable for the following:    Hemoglobin 17.2 (*)    All other components within normal limits  I-STAT TROPOININ, ED   I-STAT TROPOININ, ED    EKG  EKG Interpretation  Date/Time:  Sunday September 28 2015 17:51:08 EDT Ventricular Rate:  82 PR Interval:  178 QRS Duration: 86 QT Interval:  370 QTC Calculation: 432 R Axis:   57 Text Interpretation:  Normal sinus rhythm Normal ECG Confirmed by Jeneen Rinks  MD, [place] ([id number]) on 09/28/2015 6:55:21 PM       Radiology Dg Chest 2 View  Result Date: 09/28/2015 CLINICAL DATA:  Right-sided chest pain radiating towards back. History of MI in 2005 and 2 stents placed in 2006. EXAM: CHEST  2 VIEW COMPARISON:  Chest x-ray dated 09/10/2003. FINDINGS: Heart size is normal. Overall cardiomediastinal silhouette is normal in size and configuration. Lungs are clear. No pleural effusion or pneumothorax seen. Osseous structures about the chest are unremarkable. IMPRESSION: No active cardiopulmonary disease. Electronically Signed   By: Franki Cabot M.D.   On: 09/28/2015 18:48  Ct Angio Chest Aorta W &/or Wo Contrast  Result Date: 09/28/2015 CLINICAL DATA:  Right-sided chest pain radiating into the right arm EXAM: CT ANGIOGRAPHY CHEST WITH CONTRAST TECHNIQUE: Multidetector CT imaging of the chest was performed using the standard protocol during bolus administration of intravenous contrast. Multiplanar CT image reconstructions and MIPs were obtained to evaluate the vascular anatomy. CONTRAST:  100 mL Isovue 370. COMPARISON:  None. FINDINGS: Mediastinum/Lymph Nodes: No hilar or mediastinal adenopathy is noted. Esophagus is visualize is within normal limits. Cardiovascular: The thoracic aorta demonstrates some calcific changes without evidence of aneurysmal dilatation or dissection. Coronary calcifications are seen. Pulmonary artery demonstrates a normal branching pattern. No filling defects to suggest pulmonary emboli are identified. Lungs/Pleura: The lungs are well aerated bilaterally. Dependent atelectatic changes are noted. Mild emphysematous changes are seen. Upper abdomen: No acute  findings. Musculoskeletal: Degenerative changes of the thoracic spine are noted. Review of the MIP images confirms the above findings. IMPRESSION: No evidence of pulmonary emboli. Mild emphysematous changes. Aortic atherosclerosis. Electronically Signed   By: Inez Catalina M.D.   On: 09/28/2015 21:24   Procedures Procedures (including critical care time)  Medications Ordered in ED Medications  morphine 4 MG/ML injection 4 mg (not administered)  sodium chloride 0.9 % bolus 1,000 mL (0 mLs Intravenous Stopped 09/28/15 2152)  0.9 %  sodium chloride infusion ( Intravenous New Bag/Given 09/28/15 1942)  ondansetron (ZOFRAN) injection 4 mg (4 mg Intravenous Given 09/28/15 1941)  methocarbamol (ROBAXIN) 1,000 mg in dextrose 5 % 50 mL IVPB (0 mg Intravenous Stopped 09/28/15 2152)  iopamidol (ISOVUE-370) 76 % injection (100 mLs  Contrast Given 09/28/15 2033)     Initial Impression / Assessment and Plan / ED Course  I have reviewed the triage vital signs and the nursing notes.  Pertinent labs & imaging results that were available during my care of the patient were reviewed by me and considered in my medical decision making (see chart for details).  Clinical Course    Pains is reproducible and seems most skeletal. CTA obtained shows no dissection or pulmonary lives. EKG shows no acute ischemic changes. He is symptom-free after IV Robaxin and morphine. Second troponin normal. Acute appropriate for discharge home. Avoid use of the right upper extremity or marked physical activity. Recheck if any recurrence. Primary care for not improving over the next 2-3 days.  Final Clinical Impressions(s) / ED Diagnoses   Final diagnoses:  Chest wall pain    New Prescriptions New Prescriptions   HYDROCODONE-ACETAMINOPHEN (NORCO/VICODIN) 5-325 MG TABLET    Take 2 tablets by mouth every 4 (four) hours as needed.   METHOCARBAMOL (ROBAXIN) 500 MG TABLET    Take 1 tablet (500 mg total) by mouth 2 (two) times daily.    NAPROXEN (NAPROSYN) 500 MG TABLET    Take 1 tablet (500 mg total) by mouth 2 (two) times daily.     Tanna Furry, MD 09/29/15 [phone]

## 2015-09-30 ENCOUNTER — Telehealth: Payer: Self-pay

## 2015-09-30 NOTE — Telephone Encounter (Signed)
Letter of appeal for Crestor, along with patient's letter sent to [facility]

## 2015-11-25 ENCOUNTER — Encounter (INDEPENDENT_AMBULATORY_CARE_PROVIDER_SITE_OTHER): Payer: Self-pay

## 2015-11-25 ENCOUNTER — Ambulatory Visit (INDEPENDENT_AMBULATORY_CARE_PROVIDER_SITE_OTHER): Payer: Medicare Other | Admitting: Cardiology

## 2015-11-25 ENCOUNTER — Encounter: Payer: Self-pay | Admitting: Cardiology

## 2015-11-25 VITALS — BP 118/80 | HR 82 | Ht 69.0 in | Wt 210.0 lb

## 2015-11-25 DIAGNOSIS — E7800 Pure hypercholesterolemia, unspecified: Secondary | ICD-10-CM | POA: Diagnosis not present

## 2015-11-25 DIAGNOSIS — I2510 Atherosclerotic heart disease of native coronary artery without angina pectoris: Secondary | ICD-10-CM | POA: Diagnosis not present

## 2015-11-25 DIAGNOSIS — I209 Angina pectoris, unspecified: Secondary | ICD-10-CM

## 2015-11-25 DIAGNOSIS — I208 Other forms of angina pectoris: Secondary | ICD-10-CM | POA: Diagnosis not present

## 2015-11-25 NOTE — Patient Instructions (Signed)
Medication Instructions:  Your physician recommends that you continue on your current medications as directed. Please refer to the Current Medication list given to you today.   Labwork: None  Testing/Procedures: Your physician has requested that you have a lexiscan myoview. For further information please visit [email or web address]. Please follow instruction sheet, as given.  Follow-Up: Your physician wants you to follow-up in: 6 months with Dr. Radford Pax. You will receive a reminder letter in the mail two months in advance. If you don't receive a letter, please call our office to schedule the follow-up appointment.   Any Other Special Instructions Will Be Listed Below (If Applicable).     If you need a refill on your cardiac medications before your next appointment, please call your pharmacy.

## 2015-11-25 NOTE — Progress Notes (Signed)
Cardiology Office Note    Date:  11/25/2015   ID:  Tariq, Ginyard 05-25-1945, MRN [id number]  PCP:  Tamsen Roers, MD  Cardiologist:  Fransico Him, MD   Chief Complaint  Patient presents with  . Coronary Artery Disease  . Hypertension    History of Present Illness:  Nathan Ayala is a 70 y.o. male with a history of ASCAD, chronic chest wall pain, dyslipidemia and HTN. He is doing well. He was seen in May by the PA for left arm pain which was similar to his prior MI and relieved with SL NTG x 2.   His nitrates were increased and since then his CP has improved.  He gets tightness I his chest around once weekly but has not had to take any NTG.   He denies any SOB, diaphoresis or nausea with the CP.  He denies any DOE but has had some exertional fatigue.  He denies any  palpitations or syncope. His LE edema is stable.   Past Medical History:  Diagnosis Date  . Atypical back pain    Chronic Secondary to musculoskeletal disease of cervical spine followed by Dr. Jannifer Franklin  . Atypical chest pain    Chronic Secondary to musculoskeletal disease of cervical spine followed by Dr. Jannifer Franklin  . Benign essential HTN 07/30/2014  . Coronary artery disease 08/2003   s/p inferior lateral myocardial infarction with PCI of Proximal RCA. He is also s/p PTCA stenting of Proximal LAD September 2006  . Hyperlipidemia   . Stable angina [facility])     Past Surgical History:  Procedure Laterality Date  . arthroscopic knee surgery    . CARDIAC CATHETERIZATION    . CORONARY ANGIOPLASTY    . HERNIA REPAIR    . KIDNEY STONE SURGERY    . REPAIR OF PERFORATED ULCER      Current Medications: Outpatient Medications Prior to Visit  Medication Sig Dispense Refill  . aspirin 81 MG tablet Take 81 mg by mouth daily.    . calcium carbonate (TUMS - DOSED IN MG ELEMENTAL CALCIUM) 500 MG chewable tablet Chew 1 tablet by mouth daily as needed for indigestion or heartburn.    . clopidogrel (PLAVIX) 75 MG tablet TAKE 1  TABLET DAILY 90 tablet 3  . Coenzyme Q10 (COQ-10) 200 MG CAPS Take 1 capsule by mouth daily.    . isosorbide mononitrate (IMDUR) 30 MG 24 hr tablet Take 1 tablet (30 mg total) by mouth 2 (two) times daily. (Patient taking differently: Take 60 mg by mouth daily with lunch. ) 180 tablet 3  . nitroGLYCERIN (NITROSTAT) 0.4 MG SL tablet Place 1 tablet (0.4 mg total) under the tongue every 5 (five) minutes as needed for chest pain. 25 tablet 1  . Omega 3 1200 MG CAPS Take 1,200-2,400 mg by mouth 3 (three) times daily. Takes 2 caps in am, 1 cap at lunch, 2 caps in evening    . pantoprazole (PROTONIX) 40 MG tablet Take 1 tablet (40 mg total) by mouth as needed. 90 tablet 3  . rosuvastatin (CRESTOR) 5 MG tablet Take 1 tablet (5 mg total) by mouth daily. DAW (Patient taking differently: Take 5 mg by mouth every evening. DAW) 90 tablet 3  . vitamin B-12 (CYANOCOBALAMIN) 1000 MCG tablet Take 1,000 mcg by mouth daily.    . Ca Carbonate-Mag Hydroxide (ROLAIDS) 550-110 MG CHEW Chew 1 each by mouth daily as needed (for heartburn).    Marland Kitchen HYDROcodone-acetaminophen (NORCO/VICODIN) 5-325 MG tablet Take 2 tablets  by mouth every 4 (four) hours as needed. (Patient not taking: Reported on 11/25/2015) 10 tablet 0  . methocarbamol (ROBAXIN) 500 MG tablet Take 1 tablet (500 mg total) by mouth 2 (two) times daily. (Patient not taking: Reported on 11/25/2015) 20 tablet 0  . naproxen (NAPROSYN) 500 MG tablet Take 1 tablet (500 mg total) by mouth 2 (two) times daily. (Patient not taking: Reported on 11/25/2015) 30 tablet 0  . Omega-3 Fatty Acids (FISH OIL) 1000 MG CAPS Take 6 capsules (6,000 mg total) by mouth daily. (Patient not taking: Reported on 11/25/2015)     No facility-administered medications prior to visit.      Allergies:   Review of patient's allergies indicates no known allergies.   Social History   Social History  . Marital status: Married    Spouse name: N/A  . Number of children: N/A  . Years of education: N/A     Social History Main Topics  . Smoking status: Former Smoker    Types: Cigarettes    Quit date: 03/01/1970  . Smokeless tobacco: Never Used  . Alcohol use No  . Drug use: No  . Sexual activity: Not Asked   Other Topics Concern  . None   Social History Narrative  . None     Family History:  The patient's family history includes Diabetes in his mother; Hypertension in his mother; Kidney cancer in his mother.   ROS:   Please see the history of present illness.    ROS All other systems reviewed and are negative.  No flowsheet data found.     PHYSICAL EXAM:   VS:  BP 118/80   Pulse 82   Ht 5\' 9"  (1.753 m)   Wt 210 lb (95.3 kg)   SpO2 96%   BMI 31.01 kg/m    GEN: Well nourished, well developed, in no acute distress  HEENT: normal  Neck: no JVD, carotid bruits, or masses Cardiac: RRR; no murmurs, rubs, or gallops,no edema.  Intact distal pulses bilaterally.  Respiratory:  clear to auscultation bilaterally, normal work of breathing GI: soft, nontender, nondistended, + BS MS: no deformity or atrophy  Skin: warm and dry, no rash Neuro:  Alert and Oriented x 3, Strength and sensation are intact Psych: euthymic mood, full affect  Wt Readings from Last 3 Encounters:  11/25/15 210 lb (95.3 kg)  09/28/15 198 lb (89.8 kg)  08/25/15 205 lb 6.4 oz (93.2 kg)      Studies/Labs Reviewed:   EKG:  EKG is not ordered today.    Recent Labs: 01/31/2015: ALT 27 09/28/2015: BUN 18; Creatinine, Ser 1.44; Hemoglobin 17.2; Platelets 195; Potassium 4.3; Sodium 138   Lipid Panel    Component Value Date/Time   CHOL 111 (L) 01/31/2015 1044   TRIG 94 01/31/2015 1044   HDL 30 (L) 01/31/2015 1044   CHOLHDL 3.7 01/31/2015 1044   VLDL 19 01/31/2015 1044   LDLCALC 62 01/31/2015 1044    Additional studies/ records that were reviewed today include:  none    ASSESSMENT:    1. Atherosclerosis of native coronary artery of native heart without angina pectoris   2. Pure  hypercholesterolemia   3. Stable angina (HCC)      PLAN:  In order of problems listed above:  1. ASCAD with increased episodes of angina.  They are nitrate responsive and seem to have improved with increased long acting nitrates but still getting episodes.  Continue ASA/Plavix/long acting nitrate/statin.  I will repeat a nuclear  stress test to rule out ischemia.  2. Hyperlipidemia - LDL goal <70.  Continue statin.  Check FLP and ALT from PCP. 3. Chronic stable angina - this has not change in frequency or severity.  Continue Imdur and long acting SL NTG.    Medication Adjustments/Labs and Tests Ordered: Current medicines are reviewed at length with the patient today.  Concerns regarding medicines are outlined above.  Medication changes, Labs and Tests ordered today are listed in the Patient Instructions below.  There are no Patient Instructions on file for this visit.   Signed, Fransico Him, MD  11/25/2015 1:51 PM    [facility], [place], [place]  [address] Phone: [phone]; Fax: [phone]

## 2015-11-27 ENCOUNTER — Telehealth (HOSPITAL_COMMUNITY): Payer: Self-pay | Admitting: *Deleted

## 2015-11-27 NOTE — Telephone Encounter (Signed)
Patient given detailed instructions per Myocardial Perfusion Study Information Sheet for the test on 12/01/15. Patient notified to arrive 15 minutes early and that it is imperative to arrive on time for appointment to keep from having the test rescheduled.  If you need to cancel or reschedule your appointment, please call the office within 24 hours of your appointment. Failure to do so may result in a cancellation of your appointment, and a $50 no show fee. Patient verbalized understanding.  [name]

## 2015-12-01 ENCOUNTER — Encounter (HOSPITAL_COMMUNITY): Payer: Medicare Other

## 2015-12-08 ENCOUNTER — Telehealth (HOSPITAL_COMMUNITY): Payer: Self-pay | Admitting: *Deleted

## 2015-12-08 NOTE — Telephone Encounter (Signed)
Left message on voicemail per DPR in reference to upcoming appointment scheduled on 12/10/15 with detailed instructions given per Myocardial Perfusion Study Information Sheet for the test. LM to arrive 15 minutes early, and that it is imperative to arrive on time for appointment to keep from having the test rescheduled. If you need to cancel or reschedule your appointment, please call the office within 24 hours of your appointment. Failure to do so may result in a cancellation of your appointment, and a $50 no show fee. Phone number given for call back for any questions.  [name]

## 2015-12-10 ENCOUNTER — Ambulatory Visit (HOSPITAL_COMMUNITY): Payer: Medicare Other | Attending: Cardiovascular Disease

## 2015-12-10 DIAGNOSIS — R079 Chest pain, unspecified: Secondary | ICD-10-CM | POA: Diagnosis present

## 2015-12-10 DIAGNOSIS — I2510 Atherosclerotic heart disease of native coronary artery without angina pectoris: Secondary | ICD-10-CM | POA: Diagnosis not present

## 2015-12-10 DIAGNOSIS — I208 Other forms of angina pectoris: Secondary | ICD-10-CM

## 2015-12-10 LAB — MYOCARDIAL PERFUSION IMAGING
CSEPPHR: 90 {beats}/min
LHR: 0.36
LVDIAVOL: 79 mL (ref 62–150)
LVSYSVOL: 26 mL
Rest HR: 56 {beats}/min
SDS: 2
SRS: 4
SSS: 6
TID: 1.04

## 2015-12-10 MED ORDER — TECHNETIUM TC 99M TETROFOSMIN IV KIT
11.0000 | PACK | Freq: Once | INTRAVENOUS | Status: AC | PRN
  Administered 2015-12-10: 11 via INTRAVENOUS
  Filled 2015-12-10: qty 11

## 2015-12-10 MED ORDER — TECHNETIUM TC 99M TETROFOSMIN IV KIT
32.5000 | PACK | Freq: Once | INTRAVENOUS | Status: AC | PRN
  Administered 2015-12-10: 32.5 via INTRAVENOUS
  Filled 2015-12-10: qty 33

## 2015-12-10 MED ORDER — REGADENOSON 0.4 MG/5ML IV SOLN
0.4000 mg | Freq: Once | INTRAVENOUS | Status: AC
  Administered 2015-12-10: 0.4 mg via INTRAVENOUS

## 2015-12-11 ENCOUNTER — Telehealth: Payer: Self-pay | Admitting: Cardiology

## 2015-12-11 NOTE — Telephone Encounter (Signed)
Notes Recorded by Sueanne Margarita, MD on 12/10/2015 at 2:04 PM EDT Please find out if he is still having CP ------  Notes Recorded by Sueanne Margarita, MD on 12/10/2015 at 2:04 PM EDT Please let patient know that stress test was fine     Informed patient of results and verbal understanding expressed.  Patient states he has not experienced CP in a while. He understands to call if CP reoccurs.------

## 2015-12-11 NOTE — Telephone Encounter (Signed)
Nathan Ayala is returning a call about his stress test results . Please call   Thanks

## 2016-02-12 ENCOUNTER — Other Ambulatory Visit: Payer: Self-pay | Admitting: Cardiology

## 2016-02-16 ENCOUNTER — Other Ambulatory Visit: Payer: Self-pay | Admitting: Cardiology

## 2016-02-17 NOTE — Telephone Encounter (Signed)
Medication Detail    Disp Refills Start End   rosuvastatin (CRESTOR) 5 MG tablet 90 tablet 3 08/27/2015    Sig - Route: Take 1 tablet (5 mg total) by mouth daily. DAW - Oral   Patient taking differently: Take 5 mg by mouth every evening. DAW       E-Prescribing Status: Receipt confirmed by pharmacy (08/27/2015 2:06 PM EDT)   Pharmacy   [place], [place]

## 2016-02-25 ENCOUNTER — Other Ambulatory Visit: Payer: Self-pay | Admitting: *Deleted

## 2016-02-25 MED ORDER — ROSUVASTATIN CALCIUM 5 MG PO TABS: 5.0000 mg | ORAL_TABLET | Freq: Every day | ORAL | 0 refills | Status: DC

## 2016-02-25 MED ORDER — ROSUVASTATIN CALCIUM 5 MG PO TABS: 5.0000 mg | ORAL_TABLET | Freq: Every day | ORAL | 2 refills | Status: DC

## 2016-03-02 ENCOUNTER — Other Ambulatory Visit: Payer: Self-pay

## 2016-03-02 MED ORDER — ROSUVASTATIN CALCIUM 5 MG PO TABS: 5.0000 mg | ORAL_TABLET | Freq: Every day | ORAL | 11 refills | Status: DC

## 2016-03-15 ENCOUNTER — Telehealth: Payer: Self-pay

## 2016-03-15 NOTE — Telephone Encounter (Signed)
Letter from [facility] stating we authorized a change from Crestor to Rosuvastatin. Crestor has been denied in the past year. Patient aware.

## 2016-03-19 ENCOUNTER — Other Ambulatory Visit: Payer: Self-pay | Admitting: *Deleted

## 2016-03-19 MED ORDER — ROSUVASTATIN CALCIUM 5 MG PO TABS: 5.0000 mg | ORAL_TABLET | Freq: Every day | ORAL | 3 refills | Status: DC

## 2016-05-23 ENCOUNTER — Encounter: Payer: Self-pay | Admitting: Cardiology

## 2016-05-23 NOTE — Progress Notes (Signed)
Cardiology Office Note    Date:  05/24/2016   ID:  Dammon, Makarewicz [birth year], MRN [id number]  PCP:  Tamsen Roers, MD  Cardiologist:  Fransico Him, MD   Chief Complaint  Patient presents with  . Coronary Artery Disease  . Hyperlipidemia    History of Present Illness:  Nathan Ayala is a 71 y.o. male with a history of ASCAD, chronic chest wall pain, dyslipidemia and HTN. He is doing well. He has chronic stable angina and has not had any CP since I saw him last.   He denies any DOE, PND, orthopnea, palpitations or syncope. His LE edema is stable.He is frustrated that he cannot lose weight but he is only walking about 15 minutes a day. He denies any claudication or numbness in his feet.    Past Medical History:  Diagnosis Date  . Atypical chest pain    Chronic Secondary to musculoskeletal disease of cervical spine followed by Dr. Jannifer Franklin  . Benign essential HTN 07/30/2014  . Coronary artery disease 08/2003   s/p inferior lateral myocardial infarction with PCI of Proximal RCA. He is also s/p PTCA stenting of Proximal LAD September 2006  . Hyperlipidemia   . Stable angina [facility])     Past Surgical History:  Procedure Laterality Date  . arthroscopic knee surgery    . CARDIAC CATHETERIZATION    . CORONARY ANGIOPLASTY    . HERNIA REPAIR    . KIDNEY STONE SURGERY    . REPAIR OF PERFORATED ULCER      Current Medications: Current Meds  Medication Sig  . aspirin 81 MG tablet Take 81 mg by mouth daily.  . clopidogrel (PLAVIX) 75 MG tablet Take 1 tablet (75 mg total) by mouth daily.  . Coenzyme Q10 (COQ-10) 200 MG CAPS Take 1 capsule by mouth daily.  . isosorbide mononitrate (IMDUR) 30 MG 24 hr tablet Take 1 tablet (30 mg total) by mouth 2 (two) times daily.  Marland Kitchen NEXIUM 40 MG capsule Take 40 mg by mouth daily.  . nitroGLYCERIN (NITROSTAT) 0.4 MG SL tablet Place 1 tablet (0.4 mg total) under the tongue every 5 (five) minutes as needed for chest pain.  . Omega 3 1200 MG CAPS Take  1,200-2,400 mg by mouth 3 (three) times daily. Takes 2 caps in am, 1 cap at lunch, 2 caps in evening  . pantoprazole (PROTONIX) 40 MG tablet Take 1 tablet (40 mg total) by mouth as needed.  . rosuvastatin (CRESTOR) 5 MG tablet Take 1 tablet (5 mg total) by mouth daily. DAW  . vitamin B-12 (CYANOCOBALAMIN) 1000 MCG tablet Take 1,000 mcg by mouth daily.    Allergies:   Patient has no known allergies.   Social History   Social History  . Marital status: Married    Spouse name: N/A  . Number of children: N/A  . Years of education: N/A   Social History Main Topics  . Smoking status: Former Smoker    Types: Cigarettes    Quit date: 03/01/1970  . Smokeless tobacco: Never Used  . Alcohol use No  . Drug use: No  . Sexual activity: Not Asked   Other Topics Concern  . None   Social History Narrative  . None     Family History:  The patient's family history includes Diabetes in his mother; Hypertension in his mother; Kidney cancer in his mother.   ROS:   Please see the history of present illness.    ROS All other systems reviewed  and are negative.  No flowsheet data found.     PHYSICAL EXAM:   VS:  BP 130/80   Pulse 74   Ht 5\' 10"  (1.778 m)   Wt 208 lb 1.9 oz (94.4 kg)   SpO2 98%   BMI 29.86 kg/m    GEN: Well nourished, well developed, in no acute distress  HEENT: normal  Neck: no JVD, carotid bruits, or masses Cardiac: RRR; no murmurs, rubs, or gallops,no edema.  Intact distal pulses bilaterally.  Respiratory:  clear to auscultation bilaterally, normal work of breathing GI: soft, nontender, nondistended, + BS MS: no deformity or atrophy  Skin: warm and dry, no rash Neuro:  Alert and Oriented x 3, Strength and sensation are intact Psych: euthymic mood, full affect  Wt Readings from Last 3 Encounters:  05/24/16 208 lb 1.9 oz (94.4 kg)  12/10/15 210 lb (95.3 kg)  11/25/15 210 lb (95.3 kg)      Studies/Labs Reviewed:   EKG:  EKG is not ordered today.    Recent  Labs: 09/28/2015: BUN 18; Creatinine, Ser 1.44; Hemoglobin 17.2; Platelets 195; Potassium 4.3; Sodium 138   Lipid Panel    Component Value Date/Time   CHOL 111 (L) 01/31/2015 1044   TRIG 94 01/31/2015 1044   HDL 30 (L) 01/31/2015 1044   CHOLHDL 3.7 01/31/2015 1044   VLDL 19 01/31/2015 1044   LDLCALC 62 01/31/2015 1044    Additional studies/ records that were reviewed today include:  none    ASSESSMENT:    1. Atherosclerosis of native coronary artery of native heart with stable angina pectoris ([place])   2. Pure hypercholesterolemia   3. Bradycardia      PLAN:  In order of problems listed above:  1. ASCAD s/p inferior lateral myocardial infarction with PCI of Proximal RCA. He is also s/p PTCA stenting of Proximal LAD September 2006.  He has chronic stable angina which has not changed.  He will continue on ASA/Plavix/long acting nitrate and statin.   2. Hyperlipidemia - His LDL was at goal over the summer at 89.  I will repeat an FLP and ALT.  He will continue on statin.  3. Bradycardia - resolved.  He has been  asymptomatic    Medication Adjustments/Labs and Tests Ordered: Current medicines are reviewed at length with the patient today.  Concerns regarding medicines are outlined above.  Medication changes, Labs and Tests ordered today are listed in the Patient Instructions below.  There are no Patient Instructions on file for this visit.   Signed, Fransico Him, MD  05/24/2016 1:09 PM    [place], [place],   [address] Phone: [phone]; Fax: [phone]

## 2016-05-24 ENCOUNTER — Encounter: Payer: Self-pay | Admitting: Cardiology

## 2016-05-24 ENCOUNTER — Other Ambulatory Visit: Payer: Self-pay | Admitting: *Deleted

## 2016-05-24 ENCOUNTER — Ambulatory Visit (INDEPENDENT_AMBULATORY_CARE_PROVIDER_SITE_OTHER): Payer: Medicare Other | Admitting: Cardiology

## 2016-05-24 VITALS — BP 130/80 | HR 74 | Ht 70.0 in | Wt 208.1 lb

## 2016-05-24 DIAGNOSIS — E7800 Pure hypercholesterolemia, unspecified: Secondary | ICD-10-CM | POA: Diagnosis not present

## 2016-05-24 DIAGNOSIS — I209 Angina pectoris, unspecified: Secondary | ICD-10-CM

## 2016-05-24 DIAGNOSIS — I25118 Atherosclerotic heart disease of native coronary artery with other forms of angina pectoris: Secondary | ICD-10-CM | POA: Diagnosis not present

## 2016-05-24 DIAGNOSIS — R001 Bradycardia, unspecified: Secondary | ICD-10-CM | POA: Diagnosis not present

## 2016-05-24 MED ORDER — ISOSORBIDE MONONITRATE ER 30 MG PO TB24: 30.0000 mg | ORAL_TABLET | Freq: Two times a day (BID) | ORAL | 3 refills | Status: DC

## 2016-05-24 MED ORDER — CLOPIDOGREL BISULFATE 75 MG PO TABS: 75.0000 mg | ORAL_TABLET | Freq: Every day | ORAL | 0 refills | Status: DC

## 2016-05-24 NOTE — Patient Instructions (Signed)
Medication Instructions:  Your physician recommends that you continue on your current medications as directed. Please refer to the Current Medication list given to you today.   Labwork: TODAY: BMET, LFTs, Lipids  Testing/Procedures: None  Follow-Up: Your physician wants you to follow-up in: 6 months with Dr. Radford Pax. You will receive a reminder letter in the mail two months in advance. If you don't receive a letter, please call our office to schedule the follow-up appointment.   Any Other Special Instructions Will Be Listed Below (If Applicable).     If you need a refill on your cardiac medications before your next appointment, please call your pharmacy.

## 2016-05-24 NOTE — Telephone Encounter (Signed)
error 

## 2016-05-25 LAB — HEPATIC FUNCTION PANEL
ALBUMIN: 4.7 g/dL (ref 3.5–4.8)
ALT: 39 IU/L (ref 0–44)
AST: 27 IU/L (ref 0–40)
Alkaline Phosphatase: 82 IU/L (ref 39–117)
Bilirubin Total: 2.4 mg/dL — ABNORMAL HIGH (ref 0.0–1.2)
Bilirubin, Direct: 0.34 mg/dL (ref 0.00–0.40)
Total Protein: 7.1 g/dL (ref 6.0–8.5)

## 2016-05-25 LAB — LIPID PANEL
CHOLESTEROL TOTAL: 139 mg/dL (ref 100–199)
Chol/HDL Ratio: 3.8 ratio units (ref 0.0–5.0)
HDL: 37 mg/dL — AB (ref >39)
LDL Calculated: 78 mg/dL (ref 0–99)
TRIGLYCERIDES: 118 mg/dL (ref 0–149)
VLDL Cholesterol Cal: 24 mg/dL (ref 5–40)

## 2016-05-25 LAB — BASIC METABOLIC PANEL
BUN / CREAT RATIO: 13 (ref 10–24)
BUN: 18 mg/dL (ref 8–27)
CO2: 25 mmol/L (ref 18–29)
Calcium: 9.5 mg/dL (ref 8.6–10.2)
Chloride: 103 mmol/L (ref 96–106)
Creatinine, Ser: 1.36 mg/dL — ABNORMAL HIGH (ref 0.76–1.27)
GFR calc non Af Amer: 52 mL/min/{1.73_m2} — ABNORMAL LOW (ref >59)
GFR, EST AFRICAN AMERICAN: 60 mL/min/{1.73_m2} (ref >59)
Glucose: 93 mg/dL (ref 65–99)
POTASSIUM: 4.8 mmol/L (ref 3.5–5.2)
SODIUM: 138 mmol/L (ref 134–144)

## 2016-05-27 ENCOUNTER — Telehealth: Payer: Self-pay | Admitting: Cardiology

## 2016-05-27 DIAGNOSIS — E7800 Pure hypercholesterolemia, unspecified: Secondary | ICD-10-CM

## 2016-05-27 MED ORDER — ROSUVASTATIN CALCIUM 10 MG PO TABS: 10.0000 mg | ORAL_TABLET | Freq: Every day | ORAL | 3 refills | Status: DC

## 2016-05-27 NOTE — Telephone Encounter (Signed)
-----   Message from Sueanne Margarita, MD sent at 05/25/2016  3:45 PM EDT ----- LDL too high - please increase crestor to 10mg  daily and repeat FLP and ALT in 6 weeks.  Please forward to PCp - patient has a chronically elevated total bili

## 2016-05-27 NOTE — Telephone Encounter (Signed)
Follow up    If you call in the next 15 minutes he will be at home [phone]  , after 430p get him on his cell   Called again , this is his 2nd call , please call him on his cell phone

## 2016-05-27 NOTE — Telephone Encounter (Signed)
New Message     Pt is returning [place] call, about his cholesterol   He is at church please call

## 2016-05-27 NOTE — Telephone Encounter (Signed)
Informed patient of results and verbal understanding expressed.  Instructed patient to INCREASE CRESTOR to 10 mg daily. FLP and ALT scheduled 5/17. Patient agrees with treatment plan.

## 2016-07-15 ENCOUNTER — Other Ambulatory Visit: Payer: Medicare Other

## 2016-07-15 DIAGNOSIS — E7800 Pure hypercholesterolemia, unspecified: Secondary | ICD-10-CM

## 2016-07-15 LAB — LIPID PANEL
CHOLESTEROL TOTAL: 113 mg/dL (ref 100–199)
Chol/HDL Ratio: 2.9 ratio (ref 0.0–5.0)
HDL: 39 mg/dL — ABNORMAL LOW (ref >39)
LDL Calculated: 58 mg/dL (ref 0–99)
TRIGLYCERIDES: 79 mg/dL (ref 0–149)
VLDL Cholesterol Cal: 16 mg/dL (ref 5–40)

## 2016-07-15 LAB — ALT: ALT: 28 IU/L (ref 0–44)

## 2016-09-09 ENCOUNTER — Other Ambulatory Visit: Payer: Self-pay | Admitting: Cardiology

## 2016-09-09 MED ORDER — NITROGLYCERIN 0.4 MG SL SUBL
0.4000 mg | SUBLINGUAL_TABLET | SUBLINGUAL | 0 refills | Status: DC | PRN
Start: 2016-09-09 — End: 2016-09-10

## 2016-09-09 NOTE — Telephone Encounter (Signed)
Pt's medication was sent to pt's pharmacy as requested. Confirmation received.  °

## 2016-09-10 ENCOUNTER — Telehealth: Payer: Self-pay | Admitting: Cardiology

## 2016-09-10 ENCOUNTER — Other Ambulatory Visit: Payer: Self-pay | Admitting: *Deleted

## 2016-09-10 MED ORDER — NITROGLYCERIN 0.4 MG SL SUBL: 0.4000 mg | SUBLINGUAL_TABLET | SUBLINGUAL | 0 refills | Status: DC | PRN

## 2016-09-10 NOTE — Telephone Encounter (Signed)
°*  STAT* If patient is at the pharmacy, call can be transferred to refill team.   1. Which medications need to be refilled? (please list name of each medication and dose if known) Nitroglycerin 0.4 mg  2. Which pharmacy/location (including street and city if local pharmacy) is medication to be sent to? [facility] [address], [place], [place] [address]  [phone]    3. Do they need a 30 day or 90 day supply?  [place]

## 2016-10-13 DIAGNOSIS — C4489 Other specified malignant neoplasm of overlapping sites of skin: Secondary | ICD-10-CM | POA: Diagnosis not present

## 2016-10-13 DIAGNOSIS — L570 Actinic keratosis: Secondary | ICD-10-CM | POA: Diagnosis not present

## 2016-10-13 DIAGNOSIS — Z0000 Encounter for general adult medical examination without abnormal findings: Secondary | ICD-10-CM | POA: Diagnosis not present

## 2016-10-13 DIAGNOSIS — L821 Other seborrheic keratosis: Secondary | ICD-10-CM | POA: Diagnosis not present

## 2016-10-16 ENCOUNTER — Other Ambulatory Visit: Payer: Self-pay | Admitting: Cardiology

## 2016-10-18 DIAGNOSIS — L570 Actinic keratosis: Secondary | ICD-10-CM | POA: Diagnosis not present

## 2016-10-18 DIAGNOSIS — T148XXA Other injury of unspecified body region, initial encounter: Secondary | ICD-10-CM | POA: Diagnosis not present

## 2016-11-12 ENCOUNTER — Encounter: Payer: Self-pay | Admitting: Cardiology

## 2016-11-19 ENCOUNTER — Telehealth: Payer: Self-pay | Admitting: Cardiology

## 2016-11-19 NOTE — Telephone Encounter (Signed)
Walk in pt Form-please call pt about Lab Work. Placed in Doc Box.

## 2016-11-23 NOTE — Telephone Encounter (Signed)
Per Dr. Radford Pax, instructed patient to come fasting for blood work to appointment. He was grateful for call and agrees with treatment plan.

## 2016-11-25 ENCOUNTER — Ambulatory Visit (INDEPENDENT_AMBULATORY_CARE_PROVIDER_SITE_OTHER): Payer: Medicare Other | Admitting: Cardiology

## 2016-11-25 ENCOUNTER — Encounter: Payer: Self-pay | Admitting: Cardiology

## 2016-11-25 VITALS — BP 110/88 | HR 64 | Ht 69.5 in | Wt 206.4 lb

## 2016-11-25 DIAGNOSIS — R0789 Other chest pain: Secondary | ICD-10-CM

## 2016-11-25 DIAGNOSIS — Z79899 Other long term (current) drug therapy: Secondary | ICD-10-CM

## 2016-11-25 DIAGNOSIS — I25119 Atherosclerotic heart disease of native coronary artery with unspecified angina pectoris: Secondary | ICD-10-CM | POA: Diagnosis not present

## 2016-11-25 DIAGNOSIS — G8929 Other chronic pain: Secondary | ICD-10-CM | POA: Diagnosis not present

## 2016-11-25 DIAGNOSIS — E7800 Pure hypercholesterolemia, unspecified: Secondary | ICD-10-CM | POA: Diagnosis not present

## 2016-11-25 DIAGNOSIS — I209 Angina pectoris, unspecified: Secondary | ICD-10-CM

## 2016-11-25 HISTORY — DX: Other chronic pain: G89.29

## 2016-11-25 LAB — HEPATIC FUNCTION PANEL
ALK PHOS: 79 IU/L (ref 39–117)
ALT: 30 IU/L (ref 0–44)
AST: 19 IU/L (ref 0–40)
Albumin: 4.7 g/dL (ref 3.5–4.8)
BILIRUBIN, DIRECT: 0.32 mg/dL (ref 0.00–0.40)
Bilirubin Total: 2.3 mg/dL — ABNORMAL HIGH (ref 0.0–1.2)
TOTAL PROTEIN: 6.9 g/dL (ref 6.0–8.5)

## 2016-11-25 LAB — LIPID PANEL
CHOLESTEROL TOTAL: 105 mg/dL (ref 100–199)
Chol/HDL Ratio: 2.6 ratio (ref 0.0–5.0)
HDL: 40 mg/dL (ref >39)
LDL CALC: 50 mg/dL (ref 0–99)
TRIGLYCERIDES: 73 mg/dL (ref 0–149)
VLDL Cholesterol Cal: 15 mg/dL (ref 5–40)

## 2016-11-25 NOTE — Progress Notes (Signed)
Cardiology Office Note:    Date:  11/25/2016   ID:  Nathan Ayala, Nathan Ayala March 15, 1945, MRN [id number]  PCP:  Tamsen Roers, MD  Cardiologist:  Fransico Him, MD   Referring MD: Tamsen Roers, MD   Chief Complaint  Patient presents with  . Follow-up    Atherosclerosis of native coronary artery of native heart with stable angina pectoris ([place])     History of Present Illness:    Nathan Ayala is a 71 y.o. male with a hx of ASCAD (s/p inferior lateral myocardial infarction with PCI of Proximal RCA. He is also s/p PTCA stenting of Proximal LAD September 2006), chronic chest wall pain, dyslipidemia and HTN.  He is here today for followup and is doing well.  He denies any  PND, orthopnea, LE edema, dizziness, palpitations or syncope. He occasionally will have DOE if working out in the humidity and heat.  He continues to have chest tightness if he works out too hard in the heat mowing the yard.  1 SL NTG will always resolve it.    He does not think that it has changed since I saw him last.  Nuclear stress test 11/2015 showed no ischemia.   He is compliant with his meds and is tolerating meds with no SE.    Past Medical History:  Diagnosis Date  . Atypical chest pain    Chronic Secondary to musculoskeletal disease of cervical spine followed by Dr. Jannifer Franklin  . Benign essential HTN 07/30/2014  . Chronic chest wall pain 11/25/2016  . Coronary artery disease 08/2003   s/p inferior lateral myocardial infarction with PCI of Proximal RCA. He is also s/p PTCA stenting of Proximal LAD September 2006  . Hyperlipidemia   . Stable angina [facility])     Past Surgical History:  Procedure Laterality Date  . arthroscopic knee surgery    . CARDIAC CATHETERIZATION    . CORONARY ANGIOPLASTY    . HERNIA REPAIR    . KIDNEY STONE SURGERY    . REPAIR OF PERFORATED ULCER      Current Medications: Current Meds  Medication Sig  . aspirin 81 MG tablet Take 81 mg by mouth daily.  . clopidogrel (PLAVIX) 75 MG tablet Take  1 tablet (75 mg total) by mouth daily.  . Coenzyme Q10 (COQ-10) 200 MG CAPS Take 1 capsule by mouth daily.  . isosorbide mononitrate (IMDUR) 30 MG 24 hr tablet Take 1 tablet (30 mg total) by mouth 2 (two) times daily.  Marland Kitchen NEXIUM 40 MG capsule Take 40 mg by mouth daily.  . nitroGLYCERIN (NITROSTAT) 0.4 MG SL tablet Place 1 tablet (0.4 mg total) under the tongue every 5 (five) minutes as needed for chest pain.  . Omega 3 1200 MG CAPS Take 1,200-2,400 mg by mouth 3 (three) times daily. Takes 2 caps in am, 1 cap at lunch, 2 caps in evening  . rosuvastatin (CRESTOR) 10 MG tablet Take 1 tablet (10 mg total) by mouth daily.  . vitamin B-12 (CYANOCOBALAMIN) 1000 MCG tablet Take 1,000 mcg by mouth daily.     Allergies:   Patient has no known allergies.   Social History   Social History  . Marital status: Married    Spouse name: N/A  . Number of children: N/A  . Years of education: N/A   Social History Main Topics  . Smoking status: Former Smoker    Types: Cigarettes    Quit date: 03/01/1970  . Smokeless tobacco: Never Used  . Alcohol use  No  . Drug use: No  . Sexual activity: Not Asked   Other Topics Concern  . None   Social History Narrative  . None     Family History: The patient's family history includes Diabetes in his mother; Hypertension in his mother; Kidney cancer in his mother.  ROS:   Please see the history of present illness.    ROS  All other systems reviewed and negative.   EKGs/Labs/Other Studies Reviewed:    The following studies were reviewed today: none  EKG:  EKG is not ordered today.    Recent Labs: 05/24/2016: BUN 18; Creatinine, Ser 1.36; Potassium 4.8; Sodium 138 07/15/2016: ALT 28   Recent Lipid Panel    Component Value Date/Time   CHOL 113 07/15/2016 0954   TRIG 79 07/15/2016 0954   HDL 39 (L) 07/15/2016 0954   CHOLHDL 2.9 07/15/2016 0954   CHOLHDL 3.7 01/31/2015 1044   VLDL 19 01/31/2015 1044   LDLCALC 58 07/15/2016 0954    Physical Exam:     VS:  BP 110/88   Pulse 64   Ht 5' 9.5" (1.765 m)   Wt 206 lb 6.4 oz (93.6 kg)   SpO2 96%   BMI 30.04 kg/m     Wt Readings from Last 3 Encounters:  11/25/16 206 lb 6.4 oz (93.6 kg)  05/24/16 208 lb 1.9 oz (94.4 kg)  12/10/15 210 lb (95.3 kg)     GEN:  Well nourished, well developed in no acute distress HEENT: Normal NECK: No JVD; No carotid bruits LYMPHATICS: No lymphadenopathy CARDIAC: RRR, no murmurs, rubs, gallops RESPIRATORY:  Clear to auscultation without rales, wheezing or rhonchi  ABDOMEN: Soft, non-tender, non-distended MUSCULOSKELETAL:  No edema; No deformity  SKIN: Warm and dry NEUROLOGIC:  Alert and oriented x 3 PSYCHIATRIC:  Normal affect   ASSESSMENT:    1. Coronary artery disease involving native coronary artery of native heart with angina pectoris ([place])   2. Pure hypercholesterolemia   3. Chronic chest wall pain    PLAN:    In order of problems listed above:  1.  ASCAD with chronic stable angina - s/p  inferior lateral myocardial infarction with PCI of Proximal RCA. He is also s/p PTCA stenting of Proximal LAD September 2006.  He still will have angina if he mows a yard in the extreme heat and humidity that always goes away with SL NTG x 1.  This is stable and has not changed since I saw him last.  Nuclear stress test a year ago with no ischemia.  He will continue ASA 81mg  daily, Plavix 75mg  daily, Imdur 30mg  daily and statin.   2.  Hyperlipidemia with LDL goal < 70.  He will continue crestor 10mg  daily.  I will check an FLP and ALT today.   3.  Chronic chest wall pain   Medication Adjustments/Labs and Tests Ordered: Current medicines are reviewed at length with the patient today.  Concerns regarding medicines are outlined above.  No orders of the defined types were placed in this encounter.  No orders of the defined types were placed in this encounter.   Signed, Fransico Him, MD  11/25/2016 9:44 AM    [place]

## 2016-11-25 NOTE — Patient Instructions (Addendum)
Medication Instructions:  The current medical regimen is effective;  continue present plan and medications.  Labwork: Please have blood work today (Lipid/Liver)  Follow-Up: Follow up in 6 months with Dr. Radford Pax.  You will receive a letter in the mail 2 months before you are due.  Please call us when you receive this letter to schedule your follow up appointment.  If you need a refill on your cardiac medications before your next appointment, please call your pharmacy.  Thank you for choosing [facility]!!

## 2016-12-01 DIAGNOSIS — Z79899 Other long term (current) drug therapy: Secondary | ICD-10-CM | POA: Diagnosis not present

## 2016-12-02 DIAGNOSIS — D122 Benign neoplasm of ascending colon: Secondary | ICD-10-CM | POA: Diagnosis not present

## 2016-12-02 DIAGNOSIS — Z1211 Encounter for screening for malignant neoplasm of colon: Secondary | ICD-10-CM | POA: Diagnosis not present

## 2016-12-02 DIAGNOSIS — D120 Benign neoplasm of cecum: Secondary | ICD-10-CM | POA: Diagnosis not present

## 2016-12-02 DIAGNOSIS — Z86010 Personal history of colonic polyps: Secondary | ICD-10-CM | POA: Diagnosis not present

## 2016-12-02 DIAGNOSIS — D126 Benign neoplasm of colon, unspecified: Secondary | ICD-10-CM | POA: Diagnosis not present

## 2016-12-02 DIAGNOSIS — K648 Other hemorrhoids: Secondary | ICD-10-CM | POA: Diagnosis not present

## 2016-12-02 DIAGNOSIS — K635 Polyp of colon: Secondary | ICD-10-CM | POA: Diagnosis not present

## 2016-12-02 DIAGNOSIS — Z800 Family history of malignant neoplasm of digestive organs: Secondary | ICD-10-CM | POA: Diagnosis not present

## 2016-12-13 DIAGNOSIS — Z23 Encounter for immunization: Secondary | ICD-10-CM | POA: Diagnosis not present

## 2017-04-16 ENCOUNTER — Other Ambulatory Visit: Payer: Self-pay | Admitting: Cardiology

## 2017-05-04 ENCOUNTER — Other Ambulatory Visit: Payer: Self-pay | Admitting: Cardiology

## 2017-05-10 DIAGNOSIS — H6122 Impacted cerumen, left ear: Secondary | ICD-10-CM | POA: Diagnosis not present

## 2017-05-26 DIAGNOSIS — J40 Bronchitis, not specified as acute or chronic: Secondary | ICD-10-CM | POA: Diagnosis not present

## 2017-05-26 DIAGNOSIS — H10023 Other mucopurulent conjunctivitis, bilateral: Secondary | ICD-10-CM | POA: Diagnosis not present

## 2017-05-26 DIAGNOSIS — R05 Cough: Secondary | ICD-10-CM | POA: Diagnosis not present

## 2017-05-29 ENCOUNTER — Other Ambulatory Visit: Payer: Self-pay | Admitting: Cardiology

## 2017-06-03 ENCOUNTER — Ambulatory Visit: Payer: Medicare Other | Admitting: Cardiology

## 2017-08-01 ENCOUNTER — Ambulatory Visit (INDEPENDENT_AMBULATORY_CARE_PROVIDER_SITE_OTHER): Payer: Medicare Other | Admitting: Cardiology

## 2017-08-01 ENCOUNTER — Encounter: Payer: Self-pay | Admitting: Cardiology

## 2017-08-01 VITALS — BP 140/82 | HR 83 | Ht 69.5 in | Wt 204.0 lb

## 2017-08-01 DIAGNOSIS — R600 Localized edema: Secondary | ICD-10-CM | POA: Diagnosis not present

## 2017-08-01 DIAGNOSIS — I2510 Atherosclerotic heart disease of native coronary artery without angina pectoris: Secondary | ICD-10-CM | POA: Diagnosis not present

## 2017-08-01 DIAGNOSIS — I208 Other forms of angina pectoris: Secondary | ICD-10-CM

## 2017-08-01 DIAGNOSIS — E7800 Pure hypercholesterolemia, unspecified: Secondary | ICD-10-CM | POA: Diagnosis not present

## 2017-08-01 DIAGNOSIS — I2583 Coronary atherosclerosis due to lipid rich plaque: Secondary | ICD-10-CM | POA: Diagnosis not present

## 2017-08-01 HISTORY — DX: Localized edema: R60.0

## 2017-08-01 MED ORDER — HYDROCHLOROTHIAZIDE 25 MG PO TABS: 25.0000 mg | ORAL_TABLET | Freq: Every day | ORAL | 2 refills | Status: DC

## 2017-08-01 NOTE — Progress Notes (Signed)
Cardiology Office Note:    Date:  08/01/2017   ID:  Nathan Ayala, Nathan Ayala 05/16/1945, MRN [id number]  PCP:  Tamsen Roers, MD  Cardiologist:  No primary care provider on file.    Referring MD: Tamsen Roers, MD   Chief Complaint  Patient presents with  . Coronary Artery Disease  . Hyperlipidemia    History of Present Illness:    Nathan Ayala is a 72 y.o. male with a hx of ASCAD (s/p inferior lateral myocardial infarction with PCI of Proximal RCA. He is also s/p PTCA stenting of Proximal LAD September 2006), chronic chest wall pain, dyslipidemia and HTN.  He is here today for followup and is doing well.He has chronic stable angina that usually occurs if he works too hard out in the heat in his yard.  It usually resolves with one sublingual nitroglycerin.  His frequency and severity of chest pain have not changed since I saw him last.  Nuclear stress test 11/2015 showed no inducible ischemia.  He denies any  SOB, DOE, PND, orthopnea, LE edema, dizziness, palpitations or syncope. He is compliant with his meds and is tolerating meds with no SE.    Past Medical History:  Diagnosis Date  . Atypical chest pain    Chronic Secondary to musculoskeletal disease of cervical spine followed by Dr. Jannifer Franklin  . Benign essential HTN 07/30/2014  . Chronic chest wall pain 11/25/2016  . Coronary artery disease 08/2003   s/p inferior lateral myocardial infarction with PCI of Proximal RCA. He is also s/p PTCA stenting of Proximal LAD September 2006  . Edema extremities 08/01/2017  . Hyperlipidemia   . Stable angina [facility])     Past Surgical History:  Procedure Laterality Date  . arthroscopic knee surgery    . CARDIAC CATHETERIZATION    . CORONARY ANGIOPLASTY    . HERNIA REPAIR    . KIDNEY STONE SURGERY    . REPAIR OF PERFORATED ULCER      Current Medications: Current Meds  Medication Sig  . aspirin 81 MG tablet Take 81 mg by mouth daily.  . clopidogrel (PLAVIX) 75 MG tablet Take 1 tablet (75 mg total)  by mouth daily.  . Coenzyme Q10 (COQ-10) 200 MG CAPS Take 1 capsule by mouth daily.  . isosorbide mononitrate (IMDUR) 30 MG 24 hr tablet TAKE 1 TABLET TWICE A DAY  . NEXIUM 40 MG capsule Take 40 mg by mouth daily.  . nitroGLYCERIN (NITROSTAT) 0.4 MG SL tablet Place 1 tablet (0.4 mg total) under the tongue every 5 (five) minutes as needed for chest pain.  . Omega 3 1200 MG CAPS Take 1,200-2,400 mg by mouth 3 (three) times daily. Takes 2 caps in am, 1 cap at lunch, 2 caps in evening  . rosuvastatin (CRESTOR) 10 MG tablet TAKE 1 TABLET DAILY  . vitamin B-12 (CYANOCOBALAMIN) 1000 MCG tablet Take 1,000 mcg by mouth daily.     Allergies:   Patient has no known allergies.   Social History   Socioeconomic History  . Marital status: Married    Spouse name: Not on file  . Number of children: Not on file  . Years of education: Not on file  . Highest education level: Not on file  Occupational History  . Not on file  Social Needs  . Financial resource strain: Not on file  . Food insecurity:    Worry: Not on file    Inability: Not on file  . Transportation needs:    Medical:  Not on file    Non-medical: Not on file  Tobacco Use  . Smoking status: Former Smoker    Types: Cigarettes    Last attempt to quit: 03/01/1970    Years since quitting: 47.4  . Smokeless tobacco: Never Used  Substance and Sexual Activity  . Alcohol use: No    Alcohol/week: 0.0 oz  . Drug use: No  . Sexual activity: Not on file  Lifestyle  . Physical activity:    Days per week: Not on file    Minutes per session: Not on file  . Stress: Not on file  Relationships  . Social connections:    Talks on phone: Not on file    Gets together: Not on file    Attends religious service: Not on file    Active member of club or organization: Not on file    Attends meetings of clubs or organizations: Not on file    Relationship status: Not on file  Other Topics Concern  . Not on file  Social History Narrative  . Not on  file     Family History: The patient's family history includes Diabetes in his mother; Hypertension in his mother; Kidney cancer in his mother.  ROS:   Please see the history of present illness.    ROS  All other systems reviewed and negative.   EKGs/Labs/Other Studies Reviewed:    The following studies were reviewed today: none  EKG:  EKG is  ordered today.  The ekg ordered today demonstrates normal sinus rhythm with occasional PVCs inferior infarct and heart rate 83 bpm  Recent Labs: 11/25/2016: ALT 30   Recent Lipid Panel    Component Value Date/Time   CHOL 105 11/25/2016 0959   TRIG 73 11/25/2016 0959   HDL 40 11/25/2016 0959   CHOLHDL 2.6 11/25/2016 0959   CHOLHDL 3.7 01/31/2015 1044   VLDL 19 01/31/2015 1044   LDLCALC 50 11/25/2016 0959    Physical Exam:    VS:  BP 140/82   Pulse 83   Ht 5' 9.5" (1.765 m)   Wt 204 lb (92.5 kg)   SpO2 98%   BMI 29.69 kg/m     Wt Readings from Last 3 Encounters:  08/01/17 204 lb (92.5 kg)  11/25/16 206 lb 6.4 oz (93.6 kg)  05/24/16 208 lb 1.9 oz (94.4 kg)     GEN:  Well nourished, well developed in no acute distress HEENT: Normal NECK: No JVD; No carotid bruits LYMPHATICS: No lymphadenopathy CARDIAC: RRR, no murmurs, rubs, gallops RESPIRATORY:  Clear to auscultation without rales, wheezing or rhonchi  ABDOMEN: Soft, non-tender, non-distended MUSCULOSKELETAL:  No edema; No deformity  SKIN: Warm and dry NEUROLOGIC:  Alert and oriented x 3 PSYCHIATRIC:  Normal affect   ASSESSMENT:    1. Coronary artery disease due to lipid rich plaque   2. Stable angina ([facility])   3. Pure hypercholesterolemia   4. Edema extremities    PLAN:     In order of problems listed above:  1.  ASCAD -  ASCAD (s/p inferior lateral myocardial infarction with PCI of Proximal RCA. He is also s/p PTCA stenting of Proximal LAD September 2006).  He will continue on aspirin 81 mg daily, Plavix 75 mg daily, long-acting nitrates.  2.  Chronic  stable angina -is not changed in frequency or severity and he only gets chest pain when he is out working in the heat doing heavy exertional work.  He will continue on long-acting nitrates 30  mg daily.  3.  Hyperlipidemia with LDL goal less than 70.  LDL was 50 on 11/25/2016.  He will continue on Crestor 10 mg daily.  4.  Lower extremity edema -complains of occasionally having mild swelling in his ankles.  He does occasionally salt his food.  I reviewed with him a low-sodium diet but encouraged him not to use table salt.  I will give him a prescription for HCTZ 25 mg daily as needed for swelling.   Medication Adjustments/Labs and Tests Ordered: Current medicines are reviewed at length with the patient today.  Concerns regarding medicines are outlined above.  Orders Placed This Encounter  Procedures  . EKG 12-Lead   Meds ordered this encounter  Medications  . hydrochlorothiazide (HYDRODIURIL) 25 MG tablet    Sig: Take 1 tablet (25 mg total) by mouth daily.    Dispense:  15 tablet    Refill:  2    Signed, Fransico Him, MD  08/01/2017 2:53 PM    [place]

## 2017-08-01 NOTE — Patient Instructions (Addendum)
Medication Instructions:  Your physician has recommended you make the following change in your medication:  START: hydrochlorothiazide HCTZ 25 mg (1 tablet) as needed   If you need a refill on your cardiac medications, please contact your pharmacy first.  Labwork: None ordered   Testing/Procedures: None ordered   Follow-Up: Your physician wants you to follow-up in: 6 months with Dr. Radford Pax. You will receive a reminder letter in the mail two months in advance. If you don't receive a letter, please call our office to schedule the follow-up appointment.  Any Other Special Instructions Will Be Listed Below (If Applicable).   Thank you for choosing [facility], RN  [phone]  If you need a refill on your cardiac medications before your next appointment, please call your pharmacy.

## 2017-08-11 DIAGNOSIS — W57XXXA Bitten or stung by nonvenomous insect and other nonvenomous arthropods, initial encounter: Secondary | ICD-10-CM | POA: Diagnosis not present

## 2017-08-11 DIAGNOSIS — B999 Unspecified infectious disease: Secondary | ICD-10-CM | POA: Diagnosis not present

## 2017-10-15 ENCOUNTER — Other Ambulatory Visit: Payer: Self-pay | Admitting: Cardiology

## 2017-10-31 ENCOUNTER — Other Ambulatory Visit: Payer: Self-pay | Admitting: Cardiology

## 2017-12-13 ENCOUNTER — Other Ambulatory Visit: Payer: Self-pay | Admitting: Cardiology

## 2017-12-29 ENCOUNTER — Other Ambulatory Visit: Payer: Self-pay | Admitting: Cardiology

## 2018-01-05 DIAGNOSIS — Z23 Encounter for immunization: Secondary | ICD-10-CM | POA: Diagnosis not present

## 2018-04-06 ENCOUNTER — Ambulatory Visit (INDEPENDENT_AMBULATORY_CARE_PROVIDER_SITE_OTHER): Payer: Medicare Other | Admitting: Cardiology

## 2018-04-06 ENCOUNTER — Encounter: Payer: Self-pay | Admitting: Cardiology

## 2018-04-06 VITALS — BP 126/66 | HR 80 | Ht 69.5 in | Wt 209.6 lb

## 2018-04-06 DIAGNOSIS — I10 Essential (primary) hypertension: Secondary | ICD-10-CM | POA: Insufficient documentation

## 2018-04-06 DIAGNOSIS — E7800 Pure hypercholesterolemia, unspecified: Secondary | ICD-10-CM | POA: Diagnosis not present

## 2018-04-06 DIAGNOSIS — I25119 Atherosclerotic heart disease of native coronary artery with unspecified angina pectoris: Secondary | ICD-10-CM

## 2018-04-06 DIAGNOSIS — R600 Localized edema: Secondary | ICD-10-CM

## 2018-04-06 NOTE — Patient Instructions (Signed)
Medication Instructions:  Your physician recommends that you continue on your current medications as directed. Please refer to the Current Medication list given to you today.  If you need a refill on your cardiac medications before your next appointment, please call your pharmacy.   Lab work: Today: BMET, Lipid and LFT  If you have labs (blood work) drawn today and your tests are completely normal, you will receive your results only by: Marland Kitchen MyChart Message (if you have MyChart) OR . A paper copy in the mail If you have any lab test that is abnormal or we need to change your treatment, we will call you to review the results.  Testing/Procedures: None  Follow-Up: At [facility], you and your health needs are our priority.  As part of our continuing mission to provide you with exceptional heart care, we have created designated Provider Care Teams.  These Care Teams include your primary Cardiologist (physician) and Advanced Practice Providers (APPs -  Physician Assistants and Nurse Practitioners) who all work together to provide you with the care you need, when you need it. You will need a follow up appointment in 6 months.  Please call our office 2 months in advance to schedule this appointment.  You may see Fransico Him, MD or one of the following Advanced Practice Providers on your designated Care Team:   [place], PA-C Melina Copa, PA-C . Ermalinda Barrios, PA-C   ]

## 2018-04-06 NOTE — Progress Notes (Signed)
Cardiology Office Note:    Date:  04/06/2018   ID:  Kern, Gingras 09/12/1945, MRN [id number]  PCP:  Tamsen Roers, MD  Cardiologist:  Fransico Him, MD    Referring MD: Tamsen Roers, MD   Chief Complaint  Patient presents with  . Coronary Artery Disease  . Hyperlipidemia    History of Present Illness:    Nathan Ayala is a 73 y.o. male with a hx of ASCAD(s/p inferior lateral myocardial infarction with PCI of Proximal RCA. He is also s/p PTCA stenting of Proximal LAD September 2006), chronic chest wall pain, dyslipidemia and HTN. He has chronic stable angina that usually occurs if he works too hard out in the heat in his yard.  It usually resolves with one sublingual nitroglycerin.  His frequency and severity of chest pain have not changed since I saw him last.  Nuclear stress test 11/2015 showed no inducible ischemia.  He is here today for followup and is doing well.  He denies any chest pain or pressure, SOB, DOE, PND, orthopnea, LE edema, dizziness, palpitations or syncope. He is compliant with his meds and is tolerating meds with no SE.    Past Medical History:  Diagnosis Date  . Atypical chest pain    Chronic Secondary to musculoskeletal disease of cervical spine followed by Dr. Jannifer Franklin  . Benign essential HTN 07/30/2014  . Chronic chest wall pain 11/25/2016  . Coronary artery disease 08/2003   s/p inferior lateral myocardial infarction with PCI of Proximal RCA. He is also s/p PTCA stenting of Proximal LAD September 2006  . Edema extremities 08/01/2017  . Hyperlipidemia   . Stable angina [facility])     Past Surgical History:  Procedure Laterality Date  . arthroscopic knee surgery    . CARDIAC CATHETERIZATION    . CORONARY ANGIOPLASTY    . HERNIA REPAIR    . KIDNEY STONE SURGERY    . REPAIR OF PERFORATED ULCER      Current Medications: Current Meds  Medication Sig  . aspirin 81 MG tablet Take 81 mg by mouth daily.  . clopidogrel (PLAVIX) 75 MG tablet TAKE 1 TABLET  DAILY  . Coenzyme Q10 (COQ-10) 200 MG CAPS Take 1 capsule by mouth daily.  . hydrochlorothiazide (HYDRODIURIL) 25 MG tablet Take 1 tablet (25 mg total) by mouth daily.  . isosorbide mononitrate (IMDUR) 30 MG 24 hr tablet Take 1 tablet (30 mg total) by mouth 2 (two) times daily.  Marland Kitchen NEXIUM 40 MG capsule Take 40 mg by mouth daily.  . nitroGLYCERIN (NITROSTAT) 0.4 MG SL tablet DISSOLVE 1 TAB UNDER TOUNGE FOR CHEST PAIN. MAY REPEAT EVERY 5 MINUTES FOR 3 DOSES. IF NO RELIEF CALL 911 OR GO TO ER  . Omega 3 1200 MG CAPS Take 1,200-2,400 mg by mouth 3 (three) times daily. Takes 2 caps in am, 1 cap at lunch, 2 caps in evening  . rosuvastatin (CRESTOR) 10 MG tablet TAKE 1 TABLET DAILY  . vitamin B-12 (CYANOCOBALAMIN) 1000 MCG tablet Take 1,000 mcg by mouth daily.     Allergies:   Patient has no known allergies.   Social History   Socioeconomic History  . Marital status: Married    Spouse name: Not on file  . Number of children: Not on file  . Years of education: Not on file  . Highest education level: Not on file  Occupational History  . Not on file  Social Needs  . Financial resource strain: Not on file  .  Food insecurity:    Worry: Not on file    Inability: Not on file  . Transportation needs:    Medical: Not on file    Non-medical: Not on file  Tobacco Use  . Smoking status: Former Smoker    Types: Cigarettes    Last attempt to quit: 03/01/1970    Years since quitting: 48.1  . Smokeless tobacco: Never Used  Substance and Sexual Activity  . Alcohol use: No    Alcohol/week: 0.0 standard drinks  . Drug use: No  . Sexual activity: Not on file  Lifestyle  . Physical activity:    Days per week: Not on file    Minutes per session: Not on file  . Stress: Not on file  Relationships  . Social connections:    Talks on phone: Not on file    Gets together: Not on file    Attends religious service: Not on file    Active member of club or organization: Not on file    Attends meetings of  clubs or organizations: Not on file    Relationship status: Not on file  Other Topics Concern  . Not on file  Social History Narrative  . Not on file     Family History: The patient's family history includes Diabetes in his mother; Hypertension in his mother; Kidney cancer in his mother.  ROS:   Please see the history of present illness.    ROS  All other systems reviewed and negative.   EKGs/Labs/Other Studies Reviewed:    The following studies were reviewed today: none  EKG:  EKG is not ordered today.    Recent Labs: No results found for requested labs within last 8760 hours.   Recent Lipid Panel    Component Value Date/Time   CHOL 105 11/25/2016 0959   TRIG 73 11/25/2016 0959   HDL 40 11/25/2016 0959   CHOLHDL 2.6 11/25/2016 0959   CHOLHDL 3.7 01/31/2015 1044   VLDL 19 01/31/2015 1044   LDLCALC 50 11/25/2016 0959    Physical Exam:    VS:  BP 126/66   Pulse 80   Ht 5' 9.5" (1.765 m)   Wt 209 lb 9.6 oz (95.1 kg)   SpO2 97%   BMI 30.51 kg/m     Wt Readings from Last 3 Encounters:  04/06/18 209 lb 9.6 oz (95.1 kg)  08/01/17 204 lb (92.5 kg)  11/25/16 206 lb 6.4 oz (93.6 kg)     GEN:  Well nourished, well developed in no acute distress HEENT: Normal NECK: No JVD; No carotid bruits LYMPHATICS: No lymphadenopathy CARDIAC:  RRR, no murmurs, rubs, gallops RESPIRATORY:  Clear to auscultation without rales, wheezing or rhonchi  ABDOMEN: Soft, non-tender, non-distended MUSCULOSKELETAL:  No edema; No deformity  SKIN: Warm and dry NEUROLOGIC:  Alert and oriented x 3 PSYCHIATRIC:  Normal affect   ASSESSMENT:    1. Coronary artery disease involving native coronary artery of native heart with angina pectoris ([place])   2. Pure hypercholesterolemia   3. Edema of extremities    PLAN:    In order of problems listed above:  1.  ASCAD - s/p inferior lateral myocardial infarction with PCI of Proximal RCA. He is also s/p PTCA stenting of Proximal LAD September  2006.  He has chronic stable angina and mainly has noncardiac chest wall pain which is chronic.  He occasionally will take a sublingual nitroglycerin but he is only taken 3 in the past 6 months.  He says  his angina has not changed any since his last stress test in 2017 which was low risk.  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  He will continue on aspirin 81 mg daily, Plavix 75 mg daily, Imdur 30 mg daily and statin.  2.  HTN -blood pressures well controlled on exam today.  He will continue on HCTZ 25 mg daily.  I will repeat a bmet.  3.  Hyperlipidemia - LDL goal is less than 70.  His last LDL was 50 on 11/25/2016.  I will repeat an FLP and ALT.  He will continue on Crestor 10 mg daily.  4.  Lower extremity edema -appears well controlled on HCTZ.   Medication Adjustments/Labs and Tests Ordered: Current medicines are reviewed at length with the patient today.  Concerns regarding medicines are outlined above.  No orders of the defined types were placed in this encounter.  No orders of the defined types were placed in this encounter.   Signed, Fransico Him, MD  04/06/2018 2:27 PM    [place]

## 2018-04-07 LAB — HEPATIC FUNCTION PANEL
ALT: 27 IU/L (ref 0–44)
AST: 21 IU/L (ref 0–40)
Albumin: 4.6 g/dL (ref 3.7–4.7)
Alkaline Phosphatase: 80 IU/L (ref 39–117)
Bilirubin Total: 2.8 mg/dL — ABNORMAL HIGH (ref 0.0–1.2)
Bilirubin, Direct: 0.23 mg/dL (ref 0.00–0.40)
TOTAL PROTEIN: 6.8 g/dL (ref 6.0–8.5)

## 2018-04-07 LAB — BASIC METABOLIC PANEL
BUN / CREAT RATIO: 13 (ref 10–24)
BUN: 17 mg/dL (ref 8–27)
CHLORIDE: 102 mmol/L (ref 96–106)
CO2: 21 mmol/L (ref 20–29)
Calcium: 9.6 mg/dL (ref 8.6–10.2)
Creatinine, Ser: 1.34 mg/dL — ABNORMAL HIGH (ref 0.76–1.27)
GFR calc Af Amer: 60 mL/min/{1.73_m2} (ref >59)
GFR calc non Af Amer: 52 mL/min/{1.73_m2} — ABNORMAL LOW (ref >59)
GLUCOSE: 95 mg/dL (ref 65–99)
POTASSIUM: 4.9 mmol/L (ref 3.5–5.2)
SODIUM: 140 mmol/L (ref 134–144)

## 2018-04-07 LAB — LIPID PANEL
CHOLESTEROL TOTAL: 109 mg/dL (ref 100–199)
Chol/HDL Ratio: 2.8 ratio (ref 0.0–5.0)
HDL: 39 mg/dL — ABNORMAL LOW (ref >39)
LDL CALC: 52 mg/dL (ref 0–99)
TRIGLYCERIDES: 89 mg/dL (ref 0–149)
VLDL CHOLESTEROL CAL: 18 mg/dL (ref 5–40)

## 2018-05-17 DIAGNOSIS — R1030 Lower abdominal pain, unspecified: Secondary | ICD-10-CM | POA: Diagnosis not present

## 2018-05-17 DIAGNOSIS — M549 Dorsalgia, unspecified: Secondary | ICD-10-CM | POA: Diagnosis not present

## 2018-05-19 ENCOUNTER — Ambulatory Visit (INDEPENDENT_AMBULATORY_CARE_PROVIDER_SITE_OTHER): Payer: Medicare Other | Admitting: Family Medicine

## 2018-05-19 ENCOUNTER — Other Ambulatory Visit: Payer: Self-pay

## 2018-05-19 ENCOUNTER — Encounter: Payer: Self-pay | Admitting: Family Medicine

## 2018-05-19 VITALS — BP 130/78 | HR 72 | Temp 97.4°F | Ht 69.5 in | Wt 207.6 lb

## 2018-05-19 DIAGNOSIS — I25119 Atherosclerotic heart disease of native coronary artery with unspecified angina pectoris: Secondary | ICD-10-CM | POA: Diagnosis not present

## 2018-05-19 DIAGNOSIS — N200 Calculus of kidney: Secondary | ICD-10-CM | POA: Diagnosis not present

## 2018-05-19 DIAGNOSIS — R109 Unspecified abdominal pain: Secondary | ICD-10-CM | POA: Diagnosis not present

## 2018-05-19 LAB — URINALYSIS, COMPLETE
Bilirubin, UA: NEGATIVE
Glucose, UA: NEGATIVE
Ketones, UA: NEGATIVE
Leukocytes, UA: NEGATIVE
NITRITE UA: NEGATIVE
PH UA: 5 (ref 5.0–7.5)
Protein, UA: NEGATIVE
RBC, UA: NEGATIVE
Specific Gravity, UA: 1.025 (ref 1.005–1.030)
Urobilinogen, Ur: 0.2 mg/dL (ref 0.2–1.0)

## 2018-05-19 LAB — MICROSCOPIC EXAMINATION
BACTERIA UA: NONE SEEN
EPITHELIAL CELLS (NON RENAL): NONE SEEN /HPF (ref 0–10)
RBC MICROSCOPIC, UA: NONE SEEN /HPF (ref 0–2)
Renal Epithel, UA: NONE SEEN /hpf
WBC, UA: NONE SEEN /hpf (ref 0–5)

## 2018-05-19 MED ORDER — TAMSULOSIN HCL 0.4 MG PO CAPS: 0.4000 mg | ORAL_CAPSULE | Freq: Every day | ORAL | 0 refills | Status: DC

## 2018-05-19 MED ORDER — TRAMADOL HCL 50 MG PO TABS: 50.0000 mg | ORAL_TABLET | Freq: Three times a day (TID) | ORAL | 0 refills | Status: DC | PRN

## 2018-05-19 NOTE — Progress Notes (Signed)
BP 130/78   Pulse 72   Temp (!) 97.4 F (36.3 C) (Oral)   Ht 5' 9.5" (1.765 m)   Wt 207 lb 9.6 oz (94.2 kg)   BMI 30.22 kg/m    Subjective:   Patient ID: Nathan Ayala, male    DOB: October 23, 1945, 73 y.o.   MRN: [id number]  HPI: Nathan Ayala is a 73 y.o. male presenting on 05/19/2018 for New Patient (Initial Visit) and Flank Pain (left x 1 week)   HPI Patient comes in as a new patient to our clinic with complaints of left flank pain.  He says is hurting on the left side of his flank and coming around into his abdomen around to the front on the left side and coming slightly down on the front as well.  He says he has been having more urinary frequency but denies any blood in his urine or pain with urination.  He says he is just been having a lot of trouble even getting comfortable or sitting still and just about everything hurts him.  He says he has had one episode like this in the past that if it could have been a kidney stone.  Patient has taken some Aleve and it did help some initially but not since.  Relevant past medical, surgical, family and social history reviewed and updated as indicated. Interim medical history since our last visit reviewed. Allergies and medications reviewed and updated.  Review of Systems  Constitutional: Negative for chills and fever.  Respiratory: Negative for shortness of breath and wheezing.   Cardiovascular: Negative for chest pain and leg swelling.  Gastrointestinal: Positive for abdominal pain. Negative for constipation, diarrhea, nausea and vomiting.  Genitourinary: Positive for flank pain and frequency. Negative for decreased urine volume, difficulty urinating, dysuria, hematuria and urgency.  Musculoskeletal: Negative for back pain and gait problem.  Skin: Negative for rash.  All other systems reviewed and are negative.   Per HPI unless specifically indicated above   Allergies as of 05/19/2018   No Known Allergies     Medication List      Accurate as of May 19, 2018 11:59 PM. Always use your most recent med list.        aspirin 81 MG tablet Take 81 mg by mouth daily.   clopidogrel 75 MG tablet Commonly known as:  PLAVIX TAKE 1 TABLET DAILY   CoQ-10 200 MG Caps Take 1 capsule by mouth daily.   hydrochlorothiazide 25 MG tablet Commonly known as:  HYDRODIURIL Take 1 tablet (25 mg total) by mouth daily.   isosorbide mononitrate 30 MG 24 hr tablet Commonly known as:  IMDUR Take 1 tablet (30 mg total) by mouth 2 (two) times daily.   NexIUM 40 MG capsule Generic drug:  esomeprazole Take 40 mg by mouth daily.   nitroGLYCERIN 0.4 MG SL tablet Commonly known as:  NITROSTAT DISSOLVE 1 TAB UNDER TOUNGE FOR CHEST PAIN. MAY REPEAT EVERY 5 MINUTES FOR 3 DOSES. IF NO RELIEF CALL 911 OR GO TO ER   Omega 3 1200 MG Caps Take 1,200-2,400 mg by mouth 3 (three) times daily. Takes 2 caps in am, 1 cap at lunch, 2 caps in evening   rosuvastatin 10 MG tablet Commonly known as:  CRESTOR TAKE 1 TABLET DAILY   tamsulosin 0.4 MG Caps capsule Commonly known as:  FLOMAX Take 1 capsule (0.4 mg total) by mouth daily.   traMADol 50 MG tablet Commonly known as:  ULTRAM Take 1 tablet (  50 mg total) by mouth every 8 (eight) hours as needed.   vitamin B-12 1000 MCG tablet Commonly known as:  CYANOCOBALAMIN Take 1,000 mcg by mouth daily.        Objective:   BP 130/78   Pulse 72   Temp (!) 97.4 F (36.3 C) (Oral)   Ht 5' 9.5" (1.765 m)   Wt 207 lb 9.6 oz (94.2 kg)   BMI 30.22 kg/m   Wt Readings from Last 3 Encounters:  05/23/18 207 lb (93.9 kg)  05/19/18 207 lb 9.6 oz (94.2 kg)  04/06/18 209 lb 9.6 oz (95.1 kg)    Physical Exam Vitals signs and nursing note reviewed.  Constitutional:      General: He is not in acute distress.    Appearance: He is well-developed. He is not diaphoretic.  Eyes:     General: No scleral icterus.       Right eye: No discharge.     Conjunctiva/sclera: Conjunctivae normal.     Pupils:  Pupils are equal, round, and reactive to light.  Neck:     Musculoskeletal: Neck supple.     Thyroid: No thyromegaly.  Cardiovascular:     Rate and Rhythm: Normal rate and regular rhythm.     Heart sounds: Normal heart sounds. No murmur.  Pulmonary:     Effort: Pulmonary effort is normal. No respiratory distress.     Breath sounds: Normal breath sounds. No wheezing.  Abdominal:     General: Abdomen is flat.     Palpations: Abdomen is soft. There is no splenomegaly or mass.     Tenderness: There is abdominal tenderness in the left upper quadrant. There is left CVA tenderness. There is no right CVA tenderness, guarding or rebound. Negative signs include Rovsing's sign.  Musculoskeletal: Normal range of motion.  Lymphadenopathy:     Cervical: No cervical adenopathy.  Skin:    General: Skin is warm and dry.     Findings: No rash.  Neurological:     Mental Status: He is alert and oriented to person, place, and time.     Coordination: Coordination normal.  Psychiatric:        Behavior: Behavior normal.     Urinalysis: Clear  Assessment & Plan:   Problem List Items Addressed This Visit    None    Visit Diagnoses    Renal stone    -  Primary   Relevant Medications   tamsulosin (FLOMAX) 0.4 MG CAPS capsule   traMADol (ULTRAM) 50 MG tablet   Other Relevant Orders   Urinalysis, Complete (Completed)      Suspect symptoms bit renal stone, will do Flomax and tramadol and plenty of fluids and call back if worsens or does not improve, may need to go to hospital for CT scan if does not improve but try and avoid that because of his age. Follow up plan: Return if symptoms worsen or fail to improve.  Counseling provided for all of the vaccine components Orders Placed This Encounter  Procedures  . Microscopic Examination  . Urinalysis, Complete    Caryl Pina, MD [facility] 05/24/2018, 9:39 PM

## 2018-05-23 ENCOUNTER — Telehealth: Payer: Self-pay | Admitting: Family Medicine

## 2018-05-23 ENCOUNTER — Encounter (HOSPITAL_COMMUNITY): Payer: Self-pay | Admitting: Emergency Medicine

## 2018-05-23 ENCOUNTER — Emergency Department (HOSPITAL_COMMUNITY)
Admission: EM | Admit: 2018-05-23 | Discharge: 2018-05-23 | Disposition: A | Discharge status: Home / Self Care | Payer: Medicare Other | Attending: Emergency Medicine | Admitting: Emergency Medicine

## 2018-05-23 ENCOUNTER — Other Ambulatory Visit: Payer: Self-pay

## 2018-05-23 DIAGNOSIS — I2510 Atherosclerotic heart disease of native coronary artery without angina pectoris: Secondary | ICD-10-CM | POA: Diagnosis not present

## 2018-05-23 DIAGNOSIS — Z7982 Long term (current) use of aspirin: Secondary | ICD-10-CM | POA: Diagnosis not present

## 2018-05-23 DIAGNOSIS — I10 Essential (primary) hypertension: Secondary | ICD-10-CM | POA: Insufficient documentation

## 2018-05-23 DIAGNOSIS — B029 Zoster without complications: Secondary | ICD-10-CM | POA: Insufficient documentation

## 2018-05-23 DIAGNOSIS — Z79899 Other long term (current) drug therapy: Secondary | ICD-10-CM | POA: Insufficient documentation

## 2018-05-23 DIAGNOSIS — Z87891 Personal history of nicotine dependence: Secondary | ICD-10-CM | POA: Insufficient documentation

## 2018-05-23 DIAGNOSIS — R1030 Lower abdominal pain, unspecified: Secondary | ICD-10-CM | POA: Diagnosis not present

## 2018-05-23 DIAGNOSIS — R21 Rash and other nonspecific skin eruption: Secondary | ICD-10-CM | POA: Diagnosis present

## 2018-05-23 MED ORDER — ACYCLOVIR 400 MG PO TABS: 800.0000 mg | ORAL_TABLET | Freq: Every day | ORAL | 0 refills | Status: AC

## 2018-05-23 MED ORDER — HYDROCODONE-ACETAMINOPHEN 5-325 MG PO TABS: 1.0000 | ORAL_TABLET | Freq: Four times a day (QID) | ORAL | 0 refills | Status: DC | PRN

## 2018-05-23 NOTE — ED Provider Notes (Signed)
[facility] EMERGENCY DEPARTMENT Provider Note   CSN: [id number] Arrival date & time: 05/23/18  1028    History   Chief Complaint Chief Complaint  Patient presents with  . Flank Pain  . Back Pain    HPI Nathan Ayala is a 73 y.o. male.     Patient followed by [facility].  Patient's been having left flank pain and some back pain radiating towards the right lower quadrant for about 4 to 5 days.  Today he broke out with a rash.  He was being treated as possible kidney stone.  But did not have any formal studies.  Patient denies any fevers.  Any shortness of breath or cough.     Past Medical History:  Diagnosis Date  . Atypical chest pain    Chronic Secondary to musculoskeletal disease of cervical spine followed by Dr. Jannifer Franklin  . Benign essential HTN 07/30/2014  . Chronic chest wall pain 11/25/2016  . Coronary artery disease 08/2003   s/p inferior lateral myocardial infarction with PCI of Proximal RCA. He is also s/p PTCA stenting of Proximal LAD September 2006  . Edema extremities 08/01/2017  . Hyperlipidemia   . Stable angina [facility])     Patient Active Problem List   Diagnosis Date Noted  . Benign essential HTN 04/06/2018  . Edema of extremities 08/01/2017  . Chronic chest wall pain 11/25/2016  . Left arm pain 08/25/2015  . Stable angina ([facility])   . Coronary artery disease involving native coronary artery of native heart with angina pectoris ([place]) 02/02/2013  . Pure hypercholesterolemia 02/02/2013  . Encounter for long-term (current) use of other medications 02/02/2013    Past Surgical History:  Procedure Laterality Date  . arthroscopic knee surgery    . CARDIAC CATHETERIZATION    . CORONARY ANGIOPLASTY    . heart attack    . KIDNEY STONE SURGERY    . REPAIR OF PERFORATED ULCER    . VASECTOMY          Home Medications    Prior to Admission medications   Medication Sig Start Date End Date Taking? Authorizing Provider  aspirin 81 MG  tablet Take 81 mg by mouth daily.   Yes [provider]  clopidogrel (PLAVIX) 75 MG tablet TAKE 1 TABLET DAILY 10/17/17  Yes Turner, Traci R, MD  Coenzyme Q10 (COQ-10) 200 MG CAPS Take 1 capsule by mouth daily.   Yes [provider]  hydrochlorothiazide (HYDRODIURIL) 25 MG tablet Take 1 tablet (25 mg total) by mouth daily. Patient taking differently: Take 25 mg by mouth daily as needed.  08/01/17  Yes Turner, Eber Hong, MD  isosorbide mononitrate (IMDUR) 30 MG 24 hr tablet Take 1 tablet (30 mg total) by mouth 2 (two) times daily. 12/13/17  Yes Turner, Eber Hong, MD  NEXIUM 40 MG capsule Take 40 mg by mouth daily. 03/14/16  Yes [provider]  nitroGLYCERIN (NITROSTAT) 0.4 MG SL tablet DISSOLVE 1 TAB UNDER TOUNGE FOR CHEST PAIN. MAY REPEAT EVERY 5 MINUTES FOR 3 DOSES. IF NO RELIEF CALL 911 OR GO TO ER 12/29/17  Yes Turner, Traci R, MD  Omega 3 1200 MG CAPS Take 1,200-2,400 mg by mouth 3 (three) times daily. Takes 2 caps in am, 1 cap at lunch, 2 caps in evening   Yes [provider]  rosuvastatin (CRESTOR) 10 MG tablet TAKE 1 TABLET DAILY 11/02/17  Yes Turner, Eber Hong, MD  tamsulosin (FLOMAX) 0.4 MG CAPS capsule Take 1 capsule (  0.4 mg total) by mouth daily. 05/19/18  Yes Dettinger, Fransisca Kaufmann, MD  traMADol (ULTRAM) 50 MG tablet Take 1 tablet (50 mg total) by mouth every 8 (eight) hours as needed. 05/19/18  Yes Dettinger, Fransisca Kaufmann, MD  vitamin B-12 (CYANOCOBALAMIN) 1000 MCG tablet Take 1,000 mcg by mouth daily.   Yes [provider]  acyclovir (ZOVIRAX) 400 MG tablet Take 2 tablets (800 mg total) by mouth 5 (five) times daily for 7 days. 05/23/18 05/30/18  Fredia Sorrow, MD  HYDROcodone-acetaminophen (NORCO/VICODIN) 5-325 MG tablet Take 1 tablet by mouth every 6 (six) hours as needed. 05/23/18   Fredia Sorrow, MD    Family History Family History  Problem Relation Age of Onset  . Kidney cancer Mother   . Hypertension Mother   . Diabetes Mother     Social  History Social History   Tobacco Use  . Smoking status: Former Smoker    Packs/day: 1.50    Years: 10.00    Pack years: 15.00    Types: Cigarettes    Last attempt to quit: 03/01/1970    Years since quitting: 48.2  . Smokeless tobacco: Never Used  Substance Use Topics  . Alcohol use: No    Alcohol/week: 0.0 standard drinks  . Drug use: No     Allergies   Patient has no known allergies.   Review of Systems Review of Systems  Constitutional: Negative for chills and fever.  HENT: Negative for congestion, rhinorrhea and sore throat.   Eyes: Negative for visual disturbance.  Respiratory: Negative for cough and shortness of breath.   Cardiovascular: Negative for chest pain and leg swelling.  Gastrointestinal: Negative for abdominal pain, diarrhea, nausea and vomiting.  Genitourinary: Positive for flank pain. Negative for dysuria.  Musculoskeletal: Negative for back pain and neck pain.  Skin: Positive for rash.  Neurological: Negative for dizziness, light-headedness and headaches.  Hematological: Does not bruise/bleed easily.  Psychiatric/Behavioral: Negative for confusion.     Physical Exam Updated Vital Signs BP 133/77   Pulse 89   Temp 97.7 F (36.5 C)   Resp 18   Ht 1.765 m (5' 9.5")   Wt 93.9 kg   SpO2 94%   BMI 30.13 kg/m   Physical Exam Vitals signs and nursing note reviewed.  Constitutional:      Appearance: Normal appearance. He is well-developed.  HENT:     Head: Normocephalic and atraumatic.  Eyes:     Conjunctiva/sclera: Conjunctivae normal.  Neck:     Musculoskeletal: Neck supple.  Cardiovascular:     Rate and Rhythm: Normal rate and regular rhythm.     Heart sounds: Normal heart sounds. No murmur.  Pulmonary:     Effort: Pulmonary effort is normal. No respiratory distress.     Breath sounds: Normal breath sounds. No wheezing, rhonchi or rales.  Abdominal:     General: Bowel sounds are normal.     Palpations: Abdomen is soft.     Tenderness:  There is no abdominal tenderness.  Skin:    General: Skin is warm and dry.     Findings: Rash present.     Comments: Patient with vesicular rash classic for shingles left side proximally about T10 and T8 area.  Does not cross the midline.  Neurological:     General: No focal deficit present.     Mental Status: He is alert and oriented to person, place, and time.      ED Treatments / Results  Labs (all labs ordered are  listed, but only abnormal results are displayed) Labs Reviewed - No data to display  EKG None  Radiology No results found.  Procedures Procedures (including critical care time)  Medications Ordered in ED Medications - No data to display   Initial Impression / Assessment and Plan / ED Course  I have reviewed the triage vital signs and the nursing notes.  Pertinent labs & imaging results that were available during my care of the patient were reviewed by me and considered in my medical decision making (see chart for details).      Patient left flank rash dermatomal rash vesicular classic for shingles probably T8 or T10 distribution.  Patient nontoxic no acute distress.  Will be treated with acyclovir and will be treated with pain medicine will follow-up with his primary care doctor at [facility].  No concerns for any mental status changes no concerns for pneumonia at this time.     Final Clinical Impressions(s) / ED Diagnoses   Final diagnoses:  Herpes zoster without complication    ED Discharge Orders         Ordered    HYDROcodone-acetaminophen (NORCO/VICODIN) 5-325 MG tablet  Every 6 hours PRN     05/23/18 1225    acyclovir (ZOVIRAX) 400 MG tablet  5 times daily     05/23/18 1225           Fredia Sorrow, MD 05/23/18 1234

## 2018-05-23 NOTE — Telephone Encounter (Signed)
Pt states he is still having pain that is 8-10 on pain scale of 1-10 and worsening and the rash completely covers his back. Tramadol helps slightly for about 2 hours and then nothing. Pt advised he needs to go to ED for evaluation. Pt voiced understanding.

## 2018-05-23 NOTE — ED Triage Notes (Addendum)
Pt c/o of left flank and back pain x 4 days.  Saw PCP and was treated for a possible kidney stone, given meds but no relief.   Pt has rash on left flank wrapping around abdomen. Pain with movement

## 2018-05-23 NOTE — Discharge Instructions (Addendum)
Follow-up with [facility] call today to set up an appointment to be rechecked in about a week.  Take the hydrocodone as needed for pain relief.  Take the acyclovir as directed it is 5 times a day for 7 days.

## 2018-05-23 NOTE — Telephone Encounter (Signed)
Patient diagnosed with Shingles today at ER.  He was advised to follow up in 1 week with PCP.  Scheduled him for appt with Dr. Warrick Parisian 05/30/2018.

## 2018-05-25 ENCOUNTER — Other Ambulatory Visit: Payer: Self-pay

## 2018-05-26 ENCOUNTER — Encounter: Payer: Self-pay | Admitting: Family Medicine

## 2018-05-26 ENCOUNTER — Ambulatory Visit (INDEPENDENT_AMBULATORY_CARE_PROVIDER_SITE_OTHER): Payer: Medicare Other | Admitting: Family Medicine

## 2018-05-26 VITALS — BP 144/81 | HR 67 | Temp 96.8°F | Ht 69.5 in | Wt 207.4 lb

## 2018-05-26 DIAGNOSIS — I25119 Atherosclerotic heart disease of native coronary artery with unspecified angina pectoris: Secondary | ICD-10-CM

## 2018-05-26 DIAGNOSIS — B029 Zoster without complications: Secondary | ICD-10-CM

## 2018-05-26 MED ORDER — HYDROCODONE-ACETAMINOPHEN 5-325 MG PO TABS: 1.0000 | ORAL_TABLET | Freq: Four times a day (QID) | ORAL | 0 refills | Status: DC | PRN

## 2018-05-26 MED ORDER — GABAPENTIN 100 MG PO CAPS: 100.0000 mg | ORAL_CAPSULE | Freq: Three times a day (TID) | ORAL | 3 refills | Status: DC

## 2018-05-26 NOTE — Progress Notes (Signed)
BP (!) 144/81   Pulse 67   Temp (!) 96.8 F (36 C) (Oral)   Ht 5' 9.5" (1.765 m)   Wt 207 lb 6.4 oz (94.1 kg)   BMI 30.19 kg/m    Subjective:   Patient ID: Nathan Ayala, male    DOB: [birth year], 73 y.o.   MRN: [id number]  HPI: Nathan Ayala is a 73 y.o. male presenting on 05/26/2018 for Hospitalization Follow-up (3/24 AP- shingles . Patient is requesting a refill on his pain medication.)   HPI Shingles and pain follow-up Patient is coming in for shingles follow-up after ED.  He was seen here for flank pain which was concerning for possible renal stone but then he broke out 3 days later which was about 5 days ago and went into the emergency department and was found to have shingles on his left lower back.  He has been taking the acyclovir since that time and was also given some hydrocodone which does help but only last about 3 hours.  Patient denies any pain anywhere else except for on that side.  Relevant past medical, surgical, family and social history reviewed and updated as indicated. Interim medical history since our last visit reviewed. Allergies and medications reviewed and updated.  Review of Systems  Constitutional: Negative for chills and fever.  Respiratory: Negative for shortness of breath and wheezing.   Cardiovascular: Negative for chest pain and leg swelling.  Musculoskeletal: Negative for back pain and gait problem.  Skin: Positive for rash.  All other systems reviewed and are negative.   Per HPI unless specifically indicated above   Allergies as of 05/26/2018   No Known Allergies     Medication List       Accurate as of May 26, 2018 11:00 AM. Always use your most recent med list.        acyclovir 400 MG tablet Commonly known as:  Zovirax Take 2 tablets (800 mg total) by mouth 5 (five) times daily for 7 days.   aspirin 81 MG tablet Take 81 mg by mouth daily.   clopidogrel 75 MG tablet Commonly known as:  PLAVIX TAKE 1 TABLET DAILY    CoQ-10 200 MG Caps Take 1 capsule by mouth daily.   gabapentin 100 MG capsule Commonly known as:  NEURONTIN Take 1 capsule (100 mg total) by mouth 3 (three) times daily.   hydrochlorothiazide 25 MG tablet Commonly known as:  HYDRODIURIL Take 1 tablet (25 mg total) by mouth daily.   HYDROcodone-acetaminophen 5-325 MG tablet Commonly known as:  NORCO/VICODIN Take 1 tablet by mouth every 6 (six) hours as needed.   isosorbide mononitrate 30 MG 24 hr tablet Commonly known as:  IMDUR Take 1 tablet (30 mg total) by mouth 2 (two) times daily.   NexIUM 40 MG capsule Generic drug:  esomeprazole Take 40 mg by mouth daily.   nitroGLYCERIN 0.4 MG SL tablet Commonly known as:  NITROSTAT DISSOLVE 1 TAB UNDER TOUNGE FOR CHEST PAIN. MAY REPEAT EVERY 5 MINUTES FOR 3 DOSES. IF NO RELIEF CALL 911 OR GO TO ER   Omega 3 1200 MG Caps Take 1,200-2,400 mg by mouth 3 (three) times daily. Takes 2 caps in am, 1 cap at lunch, 2 caps in evening   rosuvastatin 10 MG tablet Commonly known as:  CRESTOR TAKE 1 TABLET DAILY   tamsulosin 0.4 MG Caps capsule Commonly known as:  FLOMAX Take 1 capsule (0.4 mg total) by mouth daily.   traMADol 50 MG  tablet Commonly known as:  ULTRAM Take 1 tablet (50 mg total) by mouth every 8 (eight) hours as needed.   vitamin B-12 1000 MCG tablet Commonly known as:  CYANOCOBALAMIN Take 1,000 mcg by mouth daily.        Objective:   BP (!) 144/81   Pulse 67   Temp (!) 96.8 F (36 C) (Oral)   Ht 5' 9.5" (1.765 m)   Wt 207 lb 6.4 oz (94.1 kg)   BMI 30.19 kg/m   Wt Readings from Last 3 Encounters:  05/26/18 207 lb 6.4 oz (94.1 kg)  05/23/18 207 lb (93.9 kg)  05/19/18 207 lb 9.6 oz (94.2 kg)    Physical Exam Vitals signs and nursing note reviewed.  Skin:    Findings: Rash (Vesicular rash on left flank around to left abdomen in the left upper quadrant region, consistent with shingles, tenderness to light touch palpation, no erythema or warmth) present.   Neurological:     Mental Status: He is alert.       Assessment & Plan:   Problem List Items Addressed This Visit    None    Visit Diagnoses    Herpes zoster without complication    -  Primary   Relevant Medications   HYDROcodone-acetaminophen (NORCO/VICODIN) 5-325 MG tablet   gabapentin (NEURONTIN) 100 MG capsule       Follow up plan: Return if symptoms worsen or fail to improve.  Counseling provided for all of the vaccine components No orders of the defined types were placed in this encounter.   Caryl Pina, MD [facility] 05/26/2018, 11:00 AM

## 2018-05-30 ENCOUNTER — Ambulatory Visit: Payer: Self-pay | Admitting: Family Medicine

## 2018-06-05 ENCOUNTER — Encounter: Payer: Self-pay | Admitting: Family Medicine

## 2018-06-05 ENCOUNTER — Ambulatory Visit (INDEPENDENT_AMBULATORY_CARE_PROVIDER_SITE_OTHER): Payer: Medicare Other | Admitting: Family Medicine

## 2018-06-05 ENCOUNTER — Other Ambulatory Visit: Payer: Self-pay

## 2018-06-05 DIAGNOSIS — B029 Zoster without complications: Secondary | ICD-10-CM | POA: Diagnosis not present

## 2018-06-05 MED ORDER — HYDROCODONE-ACETAMINOPHEN 5-325 MG PO TABS: 1.0000 | ORAL_TABLET | Freq: Four times a day (QID) | ORAL | 0 refills | Status: DC | PRN

## 2018-06-05 MED ORDER — GABAPENTIN 300 MG PO CAPS: 300.0000 mg | ORAL_CAPSULE | Freq: Three times a day (TID) | ORAL | 1 refills | Status: DC

## 2018-06-05 NOTE — Progress Notes (Signed)
Virtual Visit via telephone Note  I connected with Nathan Ayala on 06/05/18 at 1020 by telephone and verified that I am speaking with the correct person using two identifiers. Nathan Ayala is currently located at home and no other people are currently with her during visit. The provider, Fransisca Kaufmann , MD is located in their office at time of visit.  Call ended at 1026  I discussed the limitations, risks, security and privacy concerns of performing an evaluation and management service by telephone and the availability of in person appointments. I also discussed with the patient that there may be a patient responsible charge related to this service. The patient expressed understanding and agreed to proceed.   History and Present Illness: Patient has been still having burning sensation is significant pain that is keeping him up at night.  He has been taking the gabapentin but does not feel like it helped at all and has been using the hydrocodone and trying to stretched out to mostly uses at night times.  Patient says that the sores are starting to scab up and he does not have any more pustules and that is been improving.  He denies any fevers or chills or rashes anywhere else.  The pain is located along his left flank coming around to his abdomen where the shingles were located.  No diagnosis found.  Outpatient Encounter Medications as of 06/05/2018  Medication Sig  . aspirin 81 MG tablet Take 81 mg by mouth daily.  . clopidogrel (PLAVIX) 75 MG tablet TAKE 1 TABLET DAILY  . Coenzyme Q10 (COQ-10) 200 MG CAPS Take 1 capsule by mouth daily.  Marland Kitchen gabapentin (NEURONTIN) 100 MG capsule Take 1 capsule (100 mg total) by mouth 3 (three) times daily.  . hydrochlorothiazide (HYDRODIURIL) 25 MG tablet Take 1 tablet (25 mg total) by mouth daily. (Patient taking differently: Take 25 mg by mouth daily as needed. )  . HYDROcodone-acetaminophen (NORCO/VICODIN) 5-325 MG tablet Take 1 tablet by mouth every  6 (six) hours as needed.  . isosorbide mononitrate (IMDUR) 30 MG 24 hr tablet Take 1 tablet (30 mg total) by mouth 2 (two) times daily.  Marland Kitchen NEXIUM 40 MG capsule Take 40 mg by mouth daily.  . nitroGLYCERIN (NITROSTAT) 0.4 MG SL tablet DISSOLVE 1 TAB UNDER TOUNGE FOR CHEST PAIN. MAY REPEAT EVERY 5 MINUTES FOR 3 DOSES. IF NO RELIEF CALL 911 OR GO TO ER  . Omega 3 1200 MG CAPS Take 1,200-2,400 mg by mouth 3 (three) times daily. Takes 2 caps in am, 1 cap at lunch, 2 caps in evening  . rosuvastatin (CRESTOR) 10 MG tablet TAKE 1 TABLET DAILY  . tamsulosin (FLOMAX) 0.4 MG CAPS capsule Take 1 capsule (0.4 mg total) by mouth daily.  . traMADol (ULTRAM) 50 MG tablet Take 1 tablet (50 mg total) by mouth every 8 (eight) hours as needed.  . vitamin B-12 (CYANOCOBALAMIN) 1000 MCG tablet Take 1,000 mcg by mouth daily.   No facility-administered encounter medications on file as of 06/05/2018.     Review of Systems  Constitutional: Negative for chills and fever.  Eyes: Negative for discharge.  Respiratory: Negative for shortness of breath and wheezing.   Cardiovascular: Negative for chest pain and leg swelling.  Musculoskeletal: Negative for back pain and gait problem.  Skin: Negative for rash.  All other systems reviewed and are negative.   Observations/Objective: Patient sounds comfortable on the phone and not in any acute distress  Assessment and Plan: Problem List  Items Addressed This Visit    None    Visit Diagnoses    Herpes zoster without complication    -  Primary   Relevant Medications   HYDROcodone-acetaminophen (NORCO/VICODIN) 5-325 MG tablet   gabapentin (NEURONTIN) 300 MG capsule       Follow Up Instructions:  Follow-up as needed   I discussed the assessment and treatment plan with the patient. The patient was provided an opportunity to ask questions and all were answered. The patient agreed with the plan and demonstrated an understanding of the instructions.   The patient was  advised to call back or seek an in-person evaluation if the symptoms worsen or if the condition fails to improve as anticipated.  The above assessment and management plan was discussed with the patient. The patient verbalized understanding of and has agreed to the management plan. Patient is aware to call the clinic if symptoms persist or worsen. Patient is aware when to return to the clinic for a follow-up visit. Patient educated on when it is appropriate to go to the emergency department.    I provided 6 minutes of non-face-to-face time during this encounter.    Worthy Rancher, MD

## 2018-06-27 ENCOUNTER — Telehealth: Payer: Self-pay | Admitting: Family Medicine

## 2018-06-27 DIAGNOSIS — B029 Zoster without complications: Secondary | ICD-10-CM

## 2018-06-27 MED ORDER — GABAPENTIN 100 MG PO CAPS: 100.0000 mg | ORAL_CAPSULE | Freq: Three times a day (TID) | ORAL | 3 refills | Status: DC

## 2018-06-27 NOTE — Telephone Encounter (Signed)
I sent the gabapentin 100 mg prescription to his mail order pharmacy, if he wants to in the meantime he can cut the 300s in half until it arrives

## 2018-06-27 NOTE — Telephone Encounter (Signed)
Pt aware of meds and Discussed gabapentin

## 2018-06-27 NOTE — Telephone Encounter (Signed)
Aware by VM that we sent today to mail order - also aware if he needs a few local - we can send it in for him - he will let us know - he is OUT per note

## 2018-07-12 ENCOUNTER — Encounter: Payer: Medicare Other | Admitting: Family Medicine

## 2018-07-12 ENCOUNTER — Other Ambulatory Visit: Payer: Self-pay | Admitting: Cardiology

## 2018-08-02 ENCOUNTER — Telehealth: Payer: Self-pay | Admitting: Cardiology

## 2018-08-02 MED ORDER — HYDROCHLOROTHIAZIDE 25 MG PO TABS: 25.0000 mg | ORAL_TABLET | Freq: Every day | ORAL | 3 refills | Status: DC

## 2018-08-02 NOTE — Telephone Encounter (Signed)
Spoke with the patient, he stated he has had more swelling in the past few days. He denies any lifestyle changes to account for the change. He denied SOB and chest pain. He stated he has not been taking HCTZ, I advised him that he is supposed to take 25 mg, daily. I also offered he could purchase compression hose to help his swelling. He will call the office back if his swelling does not improve.

## 2018-08-02 NOTE — Telephone Encounter (Signed)
Restart the HCTZ that he is supposed to be on and if edema does not improve then call

## 2018-08-02 NOTE — Telephone Encounter (Signed)
New Message           Pt c/o swelling: STAT is pt has developed SOB within 24 hours  1) How much weight have you gained and in what time span? Not sure 205  2) If swelling, where is the swelling located?Ankles and feet  3) Are you currently taking a fluid pill? Yes, Rx is  a year old  4) Are you currently SOB?No   5) Do you have a log of your daily weights (if so, list)? No   6) Have you gained 3 pounds in a day or 5 pounds in a week? No sure  7) Have you traveled recently? No

## 2018-08-08 ENCOUNTER — Encounter (INDEPENDENT_AMBULATORY_CARE_PROVIDER_SITE_OTHER): Payer: Self-pay

## 2018-08-08 ENCOUNTER — Other Ambulatory Visit: Payer: Self-pay

## 2018-08-09 ENCOUNTER — Encounter: Payer: Self-pay | Admitting: Family Medicine

## 2018-08-09 ENCOUNTER — Ambulatory Visit (INDEPENDENT_AMBULATORY_CARE_PROVIDER_SITE_OTHER): Payer: Medicare Other | Admitting: Family Medicine

## 2018-08-09 VITALS — BP 122/81 | HR 68 | Temp 97.6°F | Ht 69.5 in | Wt 209.6 lb

## 2018-08-09 DIAGNOSIS — I10 Essential (primary) hypertension: Secondary | ICD-10-CM | POA: Diagnosis not present

## 2018-08-09 DIAGNOSIS — I25119 Atherosclerotic heart disease of native coronary artery with unspecified angina pectoris: Secondary | ICD-10-CM | POA: Diagnosis not present

## 2018-08-09 DIAGNOSIS — D2230 Melanocytic nevi of unspecified part of face: Secondary | ICD-10-CM

## 2018-08-09 NOTE — Progress Notes (Signed)
BP 122/81   Pulse 68   Temp 97.6 F (36.4 C) (Oral)   Ht 5' 9.5" (1.765 m)   Wt 209 lb 9.6 oz (95.1 kg)   BMI 30.51 kg/m    Subjective:   Patient ID: Nathan Ayala, male    DOB: 04/27/1945, 73 y.o.   MRN: [id number]  HPI: Nathan Ayala is a 73 y.o. male presenting on 08/09/2018 for spot on nose (Patient states that it has been there for 6-7 months )   HPI Patient has a spot on his nose is come up over the past 7 months and it is right in near his eye and it is irritated and he thinks it looks like a mole but the biggest thing is it is just irritating it straight in the groove of the crevice between his nose and his eye.  He says it has not changed over the past 7 months but just that it came up on its there and in the spot that is really bothersome.  Hypertension Patient sees cardiology for blood pressure and they recently added a diuretic.  Patient is currently on Imdur and hydrochlorothiazide, and their blood pressure today is 122/81. Patient denies any lightheadedness or dizziness. Patient denies headaches, blurred vision, chest pains, shortness of breath, or weakness. Denies any side effects from medication and is content with current medication.   Relevant past medical, surgical, family and social history reviewed and updated as indicated. Interim medical history since our last visit reviewed. Allergies and medications reviewed and updated.  Review of Systems  Constitutional: Negative for chills and fever.  Respiratory: Negative for shortness of breath and wheezing.   Cardiovascular: Negative for chest pain and leg swelling.  Musculoskeletal: Negative for back pain and gait problem.  Skin: Negative for rash.  Neurological: Negative for dizziness, weakness, light-headedness and numbness.  All other systems reviewed and are negative.   Per HPI unless specifically indicated above   Allergies as of 08/09/2018   No Known Allergies     Medication List       Accurate as  of August 09, 2018  1:41 PM. If you have any questions, ask your nurse or doctor.        aspirin 81 MG tablet Take 81 mg by mouth daily.   clopidogrel 75 MG tablet Commonly known as:  PLAVIX TAKE 1 TABLET DAILY   CoQ-10 200 MG Caps Take 1 capsule by mouth daily.   gabapentin 100 MG capsule Commonly known as:  NEURONTIN Take 1 capsule (100 mg total) by mouth 3 (three) times daily.   hydrochlorothiazide 25 MG tablet Commonly known as:  HYDRODIURIL Take 1 tablet (25 mg total) by mouth daily.   HYDROcodone-acetaminophen 5-325 MG tablet Commonly known as:  NORCO/VICODIN Take 1 tablet by mouth every 6 (six) hours as needed.   isosorbide mononitrate 30 MG 24 hr tablet Commonly known as:  IMDUR Take 1 tablet (30 mg total) by mouth 2 (two) times daily.   NexIUM 40 MG capsule Generic drug:  esomeprazole Take 40 mg by mouth daily.   nitroGLYCERIN 0.4 MG SL tablet Commonly known as:  NITROSTAT DISSOLVE 1 TAB UNDER TOUNGE FOR CHEST PAIN. MAY REPEAT EVERY 5 MINUTES FOR 3 DOSES. IF NO RELIEF CALL 911 OR GO TO ER   Omega 3 1200 MG Caps Take 1,200-2,400 mg by mouth 3 (three) times daily. Takes 2 caps in am, 1 cap at lunch, 2 caps in evening   rosuvastatin 10 MG tablet  Commonly known as:  CRESTOR TAKE 1 TABLET DAILY   tamsulosin 0.4 MG Caps capsule Commonly known as:  FLOMAX Take 1 capsule (0.4 mg total) by mouth daily.   vitamin B-12 1000 MCG tablet Commonly known as:  CYANOCOBALAMIN Take 1,000 mcg by mouth daily.        Objective:   BP 122/81   Pulse 68   Temp 97.6 F (36.4 C) (Oral)   Ht 5' 9.5" (1.765 m)   Wt 209 lb 9.6 oz (95.1 kg)   BMI 30.51 kg/m   Wt Readings from Last 3 Encounters:  08/09/18 209 lb 9.6 oz (95.1 kg)  05/26/18 207 lb 6.4 oz (94.1 kg)  05/23/18 207 lb (93.9 kg)    Physical Exam Vitals signs and nursing note reviewed.  Constitutional:      General: He is not in acute distress.    Appearance: He is well-developed. He is not diaphoretic.   Eyes:     General: No scleral icterus.    Conjunctiva/sclera: Conjunctivae normal.  Neck:     Musculoskeletal: Neck supple.     Thyroid: No thyromegaly.  Cardiovascular:     Rate and Rhythm: Normal rate and regular rhythm.     Heart sounds: Normal heart sounds. No murmur.  Pulmonary:     Effort: Pulmonary effort is normal. No respiratory distress.     Breath sounds: Normal breath sounds. No wheezing.  Musculoskeletal: Normal range of motion.  Lymphadenopathy:     Cervical: No cervical adenopathy.  Skin:    General: Skin is warm and dry.     Findings: No rash.  Neurological:     Mental Status: He is alert and oriented to person, place, and time.     Coordination: Coordination normal.  Psychiatric:        Behavior: Behavior normal.       Assessment & Plan:   Problem List Items Addressed This Visit      Cardiovascular and Mediastinum   Benign essential HTN   Relevant Orders   BMP8+EGFR    Other Visit Diagnoses    Irritated nevus of face    -  Primary   Relevant Orders   Ambulatory referral to Dermatology       Recheck BMP because he started diuretic  Send to dermatology because of the location of the nevus and to be difficult for [place] to remove it but they will be able to do it better.  , Likely benign nevus  Follow up plan: Return if symptoms worsen or fail to improve, for Patient needs a physical please schedule.  Counseling provided for all of the vaccine components Orders Placed This Encounter  Procedures  . BMP8+EGFR  . Ambulatory referral to Dermatology    Caryl Pina, MD [facility] 08/09/2018, 1:41 PM

## 2018-08-10 LAB — BMP8+EGFR
BUN/Creatinine Ratio: 12 (ref 10–24)
BUN: 16 mg/dL (ref 8–27)
CO2: 22 mmol/L (ref 20–29)
Calcium: 9.7 mg/dL (ref 8.6–10.2)
Chloride: 100 mmol/L (ref 96–106)
Creatinine, Ser: 1.29 mg/dL — ABNORMAL HIGH (ref 0.76–1.27)
GFR calc Af Amer: 63 mL/min/{1.73_m2} (ref >59)
GFR calc non Af Amer: 54 mL/min/{1.73_m2} — ABNORMAL LOW (ref >59)
Glucose: 90 mg/dL (ref 65–99)
Potassium: 4 mmol/L (ref 3.5–5.2)
Sodium: 139 mmol/L (ref 134–144)

## 2018-08-17 DIAGNOSIS — D485 Neoplasm of uncertain behavior of skin: Secondary | ICD-10-CM | POA: Diagnosis not present

## 2018-08-17 DIAGNOSIS — L821 Other seborrheic keratosis: Secondary | ICD-10-CM | POA: Diagnosis not present

## 2018-08-17 DIAGNOSIS — L819 Disorder of pigmentation, unspecified: Secondary | ICD-10-CM | POA: Diagnosis not present

## 2018-08-17 DIAGNOSIS — L570 Actinic keratosis: Secondary | ICD-10-CM | POA: Diagnosis not present

## 2018-09-08 ENCOUNTER — Ambulatory Visit: Payer: Medicare Other | Admitting: Family Medicine

## 2018-09-22 DIAGNOSIS — Z1159 Encounter for screening for other viral diseases: Secondary | ICD-10-CM | POA: Diagnosis not present

## 2018-09-23 ENCOUNTER — Other Ambulatory Visit: Payer: Self-pay | Admitting: Cardiology

## 2018-10-02 ENCOUNTER — Ambulatory Visit (INDEPENDENT_AMBULATORY_CARE_PROVIDER_SITE_OTHER): Payer: Medicare Other | Admitting: Nurse Practitioner

## 2018-10-02 ENCOUNTER — Encounter: Payer: Self-pay | Admitting: Nurse Practitioner

## 2018-10-02 ENCOUNTER — Other Ambulatory Visit: Payer: Self-pay

## 2018-10-02 DIAGNOSIS — G629 Polyneuropathy, unspecified: Secondary | ICD-10-CM | POA: Diagnosis not present

## 2018-10-02 DIAGNOSIS — I25119 Atherosclerotic heart disease of native coronary artery with unspecified angina pectoris: Secondary | ICD-10-CM | POA: Diagnosis not present

## 2018-10-02 NOTE — Progress Notes (Signed)
   Virtual Visit via telephone Note Due to COVID-19 pandemic this visit was conducted virtually. This visit type was conducted due to national recommendations for restrictions regarding the COVID-19 Pandemic (e.g. social distancing, sheltering in place) in an effort to limit this patient's exposure and mitigate transmission in our community. All issues noted in this document were discussed and addressed.  A physical exam was not performed with this format.  I connected with Nathan Ayala on 10/02/18 at 10:25 by telephone and verified that I am speaking with the correct person using two identifiers. Nathan Ayala is currently located at home and no one is currently with her during visit. The provider, Mary-Margaret Hassell Done, FNP is located in their office at time of visit.  I discussed the limitations, risks, security and privacy concerns of performing an evaluation and management service by telephone and the availability of in person appointments. I also discussed with the patient that there may be a patient responsible charge related to this service. The patient expressed understanding and agreed to proceed.   History and Present Illness:  Patient calls in today c/o a burning sensation from nostrils down to upper lip. His tongue feels raw, like it has been burnt. He had some gabapentin for shingles on his back and so he tried taking that and that makes burning sensation and tongue feeling going away. He has been taking it TID .   Review of Systems  Constitutional: Negative for diaphoresis and weight loss.  Eyes: Negative for blurred vision, double vision and pain.  Respiratory: Negative for shortness of breath.   Cardiovascular: Negative for chest pain, palpitations, orthopnea and leg swelling.  Gastrointestinal: Negative for abdominal pain.  Skin: Negative for rash.  Neurological: Negative for dizziness, sensory change, loss of consciousness, weakness and headaches.  Endo/Heme/Allergies:  Negative for polydipsia. Does not bruise/bleed easily.  Psychiatric/Behavioral: Negative for memory loss. The patient does not have insomnia.   All other systems reviewed and are negative.    Observations/Objective: Alert and orienteted- answers all questions appropriately No distress.  Assessment and Plan: Nathan Ayala in today with chief complaint of Numbness   1. Neuropathy neurontin 300 TID Let us know if needs anything else   Follow Up Instructions: prn    I discussed the assessment and treatment plan with the patient. The patient was provided an opportunity to ask questions and all were answered. The patient agreed with the plan and demonstrated an understanding of the instructions.   The patient was advised to call back or seek an in-person evaluation if the symptoms worsen or if the condition fails to improve as anticipated.  The above assessment and management plan was discussed with the patient. The patient verbalized understanding of and has agreed to the management plan. Patient is aware to call the clinic if symptoms persist or worsen. Patient is aware when to return to the clinic for a follow-up visit. Patient educated on when it is appropriate to go to the emergency department.   Time call ended:  10:45  I provided 10 minutes of non-face-to-face time during this encounter.    Mary-Margaret Hassell Done, FNP

## 2018-10-24 ENCOUNTER — Telehealth: Payer: Self-pay | Admitting: Family Medicine

## 2018-10-25 ENCOUNTER — Encounter: Payer: Self-pay | Admitting: Family Medicine

## 2018-10-25 ENCOUNTER — Ambulatory Visit (INDEPENDENT_AMBULATORY_CARE_PROVIDER_SITE_OTHER): Payer: Medicare Other | Admitting: Family Medicine

## 2018-10-25 DIAGNOSIS — B0223 Postherpetic polyneuropathy: Secondary | ICD-10-CM | POA: Diagnosis not present

## 2018-10-25 DIAGNOSIS — B029 Zoster without complications: Secondary | ICD-10-CM

## 2018-10-25 DIAGNOSIS — I25119 Atherosclerotic heart disease of native coronary artery with unspecified angina pectoris: Secondary | ICD-10-CM | POA: Diagnosis not present

## 2018-10-25 MED ORDER — VALACYCLOVIR HCL 1 G PO TABS: 1000.0000 mg | ORAL_TABLET | Freq: Two times a day (BID) | ORAL | 0 refills | Status: DC

## 2018-10-25 MED ORDER — GABAPENTIN 400 MG PO CAPS: 400.0000 mg | ORAL_CAPSULE | Freq: Three times a day (TID) | ORAL | 3 refills | Status: DC

## 2018-10-25 NOTE — Progress Notes (Signed)
Virtual Visit via telephone Note  I connected with Nathan Ayala on 10/25/18 at 1111 by telephone and verified that I am speaking with the correct person using two identifiers. Nathan Ayala is currently located at home and no other people are currently with her during visit. The provider, Fransisca Kaufmann Antwane Grose, MD is located in their office at time of visit.  Call ended at 1123  I discussed the limitations, risks, security and privacy concerns of performing an evaluation and management service by telephone and the availability of in person appointments. I also discussed with the patient that there may be a patient responsible charge related to this service. The patient expressed understanding and agreed to proceed.   History and Present Illness: Patient has burning sensation from nose down to upper lip and in his tongue.  He is taking gabapentin every eight hours and it will wear off a little bit before the 8 hours and it takes about an hour and a half before will kick in once he does take it.  He does feel like it makes it go away when it is in the system and will last up to 6 hours.  He denies any major side effects from it.  He says it is mostly on that left side.  He says there is a little bit of swelling and redness there, he did also had shingles previously on his back and side but that improved and now it is mostly in the concerned about his face.  No diagnosis found.  Outpatient Encounter Medications as of 10/25/2018  Medication Sig  . aspirin 81 MG tablet Take 81 mg by mouth daily.  . clopidogrel (PLAVIX) 75 MG tablet TAKE 1 TABLET DAILY  . Coenzyme Q10 (COQ-10) 200 MG CAPS Take 1 capsule by mouth daily.  Marland Kitchen gabapentin (NEURONTIN) 100 MG capsule Take 1 capsule (100 mg total) by mouth 3 (three) times daily.  . hydrochlorothiazide (HYDRODIURIL) 25 MG tablet Take 1 tablet (25 mg total) by mouth daily.  Marland Kitchen HYDROcodone-acetaminophen (NORCO/VICODIN) 5-325 MG tablet Take 1 tablet by mouth  every 6 (six) hours as needed.  . isosorbide mononitrate (IMDUR) 30 MG 24 hr tablet TAKE 1 TABLET TWICE A DAY  . NEXIUM 40 MG capsule Take 40 mg by mouth daily.  . nitroGLYCERIN (NITROSTAT) 0.4 MG SL tablet DISSOLVE 1 TAB UNDER TOUNGE FOR CHEST PAIN. MAY REPEAT EVERY 5 MINUTES FOR 3 DOSES. IF NO RELIEF CALL 911 OR GO TO ER  . Omega 3 1200 MG CAPS Take 1,200-2,400 mg by mouth 3 (three) times daily. Takes 2 caps in am, 1 cap at lunch, 2 caps in evening  . rosuvastatin (CRESTOR) 10 MG tablet TAKE 1 TABLET DAILY  . tamsulosin (FLOMAX) 0.4 MG CAPS capsule Take 1 capsule (0.4 mg total) by mouth daily.  . vitamin B-12 (CYANOCOBALAMIN) 1000 MCG tablet Take 1,000 mcg by mouth daily.   No facility-administered encounter medications on file as of 10/25/2018.     Review of Systems  Constitutional: Negative for chills and fever.  HENT: Negative for ear pain.   Eyes: Negative for redness and visual disturbance.  Respiratory: Negative for shortness of breath and wheezing.   Cardiovascular: Negative for chest pain and leg swelling.  Musculoskeletal: Negative for back pain and gait problem.  Skin: Positive for color change. Negative for rash and wound.  Neurological: Positive for numbness.  All other systems reviewed and are negative.   Observations/Objective: Sounds comfortable and in no acute distress  Assessment and Plan: Problem List Items Addressed This Visit      Nervous and Auditory   Shingles (herpes zoster) polyneuropathy   Relevant Medications   valACYclovir (VALTREX) 1000 MG tablet   gabapentin (NEURONTIN) 400 MG capsule    Other Visit Diagnoses    Herpes zoster without complication       Relevant Medications   valACYclovir (VALTREX) 1000 MG tablet   gabapentin (NEURONTIN) 400 MG capsule       Follow Up Instructions: We will try another course of Valtrex because it sounds like he may have gotten it somewhere else.  We will also continue gabapentin.    I discussed the  assessment and treatment plan with the patient. The patient was provided an opportunity to ask questions and all were answered. The patient agreed with the plan and demonstrated an understanding of the instructions.   The patient was advised to call back or seek an in-person evaluation if the symptoms worsen or if the condition fails to improve as anticipated.  The above assessment and management plan was discussed with the patient. The patient verbalized understanding of and has agreed to the management plan. Patient is aware to call the clinic if symptoms persist or worsen. Patient is aware when to return to the clinic for a follow-up visit. Patient educated on when it is appropriate to go to the emergency department.    I provided 12 minutes of non-face-to-face time during this encounter.    Worthy Rancher, MD

## 2018-10-26 ENCOUNTER — Other Ambulatory Visit: Payer: Self-pay | Admitting: Cardiology

## 2018-10-26 ENCOUNTER — Telehealth: Payer: Self-pay | Admitting: Family Medicine

## 2018-10-26 NOTE — Telephone Encounter (Signed)
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 Is this normal? Is there anything else he can take?

## 2018-10-26 NOTE — Telephone Encounter (Signed)
Have him try to do at least 5 days of it, it is likely not the Valtrex knocking him out but the increase in gabapentin but have him do at least 5 days of it and see how it goes.  There are other antivirals that are similar but they would be enough different to be have different side effects.

## 2018-10-26 NOTE — Telephone Encounter (Signed)
Patient notified. He will cut back on gabapentin and see if that helps

## 2018-10-27 ENCOUNTER — Ambulatory Visit: Payer: Medicare Other | Admitting: Family Medicine

## 2018-10-30 NOTE — Progress Notes (Signed)
History of Present Illness:       Virtual Visit via Telephone Note   This visit type was conducted due to national recommendations for restrictions regarding the COVID-19 Pandemic (e.g. social distancing) in an effort to limit this patient's exposure and mitigate transmission in our community.  Due to his co-morbid illnesses, this patient is at least at moderate risk for complications without adequate follow up.  This format is felt to be most appropriate for this patient at this time.  The patient did not have access to video technology/had technical difficulties with video requiring transitioning to audio format only (telephone).  All issues noted in this document were discussed and addressed.  No physical exam could be performed with this format.  Please refer to the patient's chart for his  consent to telehealth for [facility].   Evaluation Performed:  Follow-up visit  This visit type was conducted due to national recommendations for restrictions regarding the COVID-19 Pandemic (e.g. social distancing).  This format is felt to be most appropriate for this patient at this time.  All issues noted in this document were discussed and addressed.  No physical exam was performed (except for noted visual exam findings with Video Visits).  Please refer to the patient's chart (MyChart message for video visits and phone note for telephone visits) for the patient's consent to telehealth for [facility].  Date:  10/31/2018   ID:  Fayette, Fieser 1945-06-10, MRN [id number]  Patient Location:  Home  Provider location:   [place]  PCP:  Dettinger, Fransisca Kaufmann, MD  Cardiologist:  Fransico Him, MD  Electrophysiologist:  None   Chief Complaint:  CAD, HLD, HTN  History of Present Illness:    Nathan Ayala is a 73 y.o. male who presents via audio/video conferencing for a telehealth visit today.    Nathan Ayala is a 73 y.o. male with a hx of ASCAD(s/p inferior lateral myocardial  infarction with PCI of Proximal RCA. He is also s/p PTCA stenting of Proximal LAD September 2006), chronic chest wall pain, dyslipidemia and HTN. He has chronic stable angina that usually occurs if he works too hard out in the heat in his yard. It usually resolves with one sublingual nitroglycerin. His frequency and severity of chest pain have not changed since I saw him last. Nuclear stress test 11/2015 showed no inducible ischemia.  He is here today for followup and is doing well.  He has chronic stable angina and has only had to take SL NTG twice since I saw him last.  He has chronic LE edema which is well controlled on diuretics.  He denies any  SOB, DOE, PND, orthopnea,  palpitations or syncope. He has dizziness related to Gabapentin he was placed on for shingles.  He is compliant with his meds and is tolerating meds with no SE.    The patient does not have symptoms concerning for COVID-19 infection (fever, chills, cough, or new shortness of breath).   Prior CV studies:   The following studies were reviewed today:  none  Past Medical History:  Diagnosis Date  . Atypical chest pain    Chronic Secondary to musculoskeletal disease of cervical spine followed by Dr. Jannifer Franklin  . Benign essential HTN 07/30/2014  . Chronic chest wall pain 11/25/2016  . Coronary artery disease 08/2003   s/p inferior lateral myocardial infarction with PCI of Proximal RCA. He is also s/p PTCA stenting of Proximal LAD September 2006  . Edema extremities 08/01/2017  .  Hyperlipidemia   . Stable angina [facility])    Past Surgical History:  Procedure Laterality Date  . arthroscopic knee surgery    . CARDIAC CATHETERIZATION    . CORONARY ANGIOPLASTY    . heart attack    . KIDNEY STONE SURGERY    . REPAIR OF PERFORATED ULCER    . VASECTOMY       Current Meds  Medication Sig  . aspirin 81 MG tablet Take 81 mg by mouth daily.  . clopidogrel (PLAVIX) 75 MG tablet Take 75 mg by mouth daily.  . Coenzyme Q10 (COQ-10) 200  MG CAPS Take 1 capsule by mouth daily.  Marland Kitchen gabapentin (NEURONTIN) 400 MG capsule Take 1 capsule (400 mg total) by mouth 3 (three) times daily.  . hydrochlorothiazide (HYDRODIURIL) 25 MG tablet Take 1 tablet (25 mg total) by mouth daily.  . isosorbide mononitrate (IMDUR) 30 MG 24 hr tablet Take 30 mg by mouth 2 (two) times daily.  . nitroGLYCERIN (NITROSTAT) 0.4 MG SL tablet DISSOLVE 1 TAB UNDER TOUNGE FOR CHEST PAIN. MAY REPEAT EVERY 5 MINUTES FOR 3 DOSES. IF NO RELIEF CALL 911 OR GO TO ER  . Omega 3 1200 MG CAPS Take 1,200-2,400 mg by mouth 3 (three) times daily. Takes 2 caps in am, 2 caps at lunch, 1 cap in evening  . rosuvastatin (CRESTOR) 10 MG tablet Take 10 mg by mouth daily.  . valACYclovir (VALTREX) 1000 MG tablet Take 1 tablet (1,000 mg total) by mouth 2 (two) times daily.  . vitamin B-12 (CYANOCOBALAMIN) 1000 MCG tablet Take 1,000 mcg by mouth daily.     Allergies:   Patient has no known allergies.   Social History   Tobacco Use  . Smoking status: Former Smoker    Packs/day: 1.50    Years: 10.00    Pack years: 15.00    Types: Cigarettes    Quit date: 03/01/1970    Years since quitting: 48.7  . Smokeless tobacco: Never Used  Substance Use Topics  . Alcohol use: No    Alcohol/week: 0.0 standard drinks  . Drug use: No     Family Hx: The patient's family history includes Diabetes in his mother; Hypertension in his mother; Kidney cancer in his mother.  ROS:   Please see the history of present illness.    none All other systems reviewed and are negative.   Labs/Other Tests and Data Reviewed:    Recent Labs: 04/06/2018: ALT 27 08/09/2018: BUN 16; Creatinine, Ser 1.29; Potassium 4.0; Sodium 139   Recent Lipid Panel Lab Results  Component Value Date/Time   CHOL 109 04/06/2018 02:42 PM   TRIG 89 04/06/2018 02:42 PM   HDL 39 (L) 04/06/2018 02:42 PM   CHOLHDL 2.8 04/06/2018 02:42 PM   CHOLHDL 3.7 01/31/2015 10:44 AM   LDLCALC 52 04/06/2018 02:42 PM    Wt Readings from  Last 3 Encounters:  10/31/18 205 lb (93 kg)  08/09/18 209 lb 9.6 oz (95.1 kg)  05/26/18 207 lb 6.4 oz (94.1 kg)     Objective:    Vital Signs:  Ht 5' 9.5" (1.765 m)   Wt 205 lb (93 kg)   BMI 29.84 kg/m    ASSESSMENT & PLAN:      1.  ASCAD - s/p inferior lateral myocardial infarction with PCI of Proximal RCA. He is also s/p PTCA stenting of Proximal LAD September 2006.  He has chronic stable angina and mainly has noncardiac chest wall pain which is chronic. he has only  taken SL NTG twice since I saw him last. Continue ASA, Plavix, Imdur and statin.   2.  HTN -   Continue HCTZ 25mg  daily.  Creatinine was stable at 1.29.    3.  HLD - LDL at goal at 52 in Feb 2020.  Continue Crestor 10mg  daily.  4.  LE edema - well controlled on diuretics.  Creatinine stable at 1.29 in June 2020.  COVID-19 Education: The signs and symptoms of COVID-19 were discussed with the patient and how to seek care for testing (follow up with PCP or arrange E-visit).  The importance of social distancing was discussed today.  Patient Risk:   After full review of this patient's clinical status, I feel that they are at least moderate risk at this time.  Time:   Today, I have spent 20 minutes directly with the patient on telemedicine discussing medical problems including CAD, HTN, HLD.  We also reviewed the symptoms of COVID 19 and the ways to protect against contracting the virus with telehealth technology.  I spent an additional 5 minutes reviewing patient's chart including labs.  Medication Adjustments/Labs and Tests Ordered: Current medicines are reviewed at length with the patient today.  Concerns regarding medicines are outlined above.  Tests Ordered: No orders of the defined types were placed in this encounter.  Medication Changes: No orders of the defined types were placed in this encounter.   Disposition:  Follow up in 1 year(s)  Signed, Fransico Him, MD  10/31/2018 10:26 AM    [facility]

## 2018-10-31 ENCOUNTER — Telehealth (INDEPENDENT_AMBULATORY_CARE_PROVIDER_SITE_OTHER): Payer: Medicare Other | Admitting: Cardiology

## 2018-10-31 ENCOUNTER — Encounter: Payer: Self-pay | Admitting: Cardiology

## 2018-10-31 ENCOUNTER — Encounter: Payer: Self-pay | Admitting: *Deleted

## 2018-10-31 ENCOUNTER — Other Ambulatory Visit: Payer: Self-pay

## 2018-10-31 VITALS — Ht 69.5 in | Wt 205.0 lb

## 2018-10-31 DIAGNOSIS — I25119 Atherosclerotic heart disease of native coronary artery with unspecified angina pectoris: Secondary | ICD-10-CM | POA: Diagnosis not present

## 2018-10-31 DIAGNOSIS — I10 Essential (primary) hypertension: Secondary | ICD-10-CM | POA: Diagnosis not present

## 2018-10-31 DIAGNOSIS — E7800 Pure hypercholesterolemia, unspecified: Secondary | ICD-10-CM | POA: Diagnosis not present

## 2018-10-31 DIAGNOSIS — R600 Localized edema: Secondary | ICD-10-CM | POA: Diagnosis not present

## 2018-10-31 NOTE — Patient Instructions (Signed)
Medication Instructions:   Your physician recommends that you continue on your current medications as directed. Please refer to the Current Medication list given to you today.  If you need a refill on your cardiac medications before your next appointment, please call your pharmacy.     Follow-Up: At [facility], you and your health needs are our priority.  As part of our continuing mission to provide you with exceptional heart care, we have created designated Provider Care Teams.  These Care Teams include your primary Cardiologist (physician) and Advanced Practice Providers (APPs -  Physician Assistants and Nurse Practitioners) who all work together to provide you with the care you need, when you need it. You will need a follow up appointment in 12 months with Dr. Radford Pax.  Please call our office 2 months in advance to schedule this appointment.  You may see Fransico Him, MD or one of the following Advanced Practice Providers on your designated Care Team:   [place], PA-C Melina Copa, PA-C . Ermalinda Barrios, PA-C

## 2018-11-22 ENCOUNTER — Telehealth: Payer: Self-pay | Admitting: Cardiology

## 2018-11-22 NOTE — Telephone Encounter (Signed)
I spoke with pt. He reports swelling in feet and ankles over the past week.  Left may be slightly more swollen than right. Swelling is not present in the morning. Progresses as day goes on. 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  May work from 9 AM to 9 PM some days. Does not weigh daily. Last weight in chart of 205 lbs was just an estimate.  He weighed while on phone with me and current weight is 214 lbs.  He estimates at least 6 lbs of this is clothes and items in his pockets.  No shortness of breath.  Has been eating out more in the last couple of weeks.  Tries to watch salt intake but states he does eat hot dogs a couple times per week. He is trying to keep feet elevated when possible. Taking HCTZ as listed. I advised pt to try and limit salt intake, keep legs elevated when he can and weigh daily every morning.  Will forward to Dr. Radford Pax for review/recommendations.

## 2018-11-22 NOTE — Telephone Encounter (Signed)
I think if he stops eating out and avoids any added salt in his diet the fluid will come off with HCTZ.  He needs to avoid hot dogs and all processed meats.  Call if edema does not resolved with dietary changes.  Can try compression hose during the day as well.

## 2018-11-22 NOTE — Telephone Encounter (Signed)
Pt c/o swelling: STAT is pt has developed SOB within 24 hours  1) How much weight have you gained and in what time span? * did not know how much weigh he gained   2) If swelling, where is the swelling located?  Feet and ankles  3) Are you currently taking a fluid pill? *yes  4) Are you currently SOB? no  5) Do you have a log of your daily weights (if so, list)?  no  6) Have you gained 3 pounds in a day or 5 pounds in a week?  He does not think so  7) Have you traveled recently? * no- only to  [place], [place]

## 2018-11-23 NOTE — Telephone Encounter (Signed)
Called patient with Dr. Theodosia Blender recommendations. Patient stated it's hard for him not to eat out, because his is always out traveling and away from home during the day. Encouraged patient to pack a lunch, or make healthier decision when eating out. Informed patient that he needs to keep his sodium below 2000 mg daily. Patient verbalized understanding and will try diet changes.

## 2018-12-12 ENCOUNTER — Telehealth: Payer: Self-pay | Admitting: Family Medicine

## 2018-12-12 ENCOUNTER — Other Ambulatory Visit: Payer: Self-pay

## 2018-12-13 ENCOUNTER — Ambulatory Visit (INDEPENDENT_AMBULATORY_CARE_PROVIDER_SITE_OTHER): Payer: Medicare Other | Admitting: Family Medicine

## 2018-12-13 ENCOUNTER — Encounter: Payer: Self-pay | Admitting: Family Medicine

## 2018-12-13 DIAGNOSIS — Z125 Encounter for screening for malignant neoplasm of prostate: Secondary | ICD-10-CM

## 2018-12-13 DIAGNOSIS — I25119 Atherosclerotic heart disease of native coronary artery with unspecified angina pectoris: Secondary | ICD-10-CM

## 2018-12-13 DIAGNOSIS — E7800 Pure hypercholesterolemia, unspecified: Secondary | ICD-10-CM | POA: Diagnosis not present

## 2018-12-13 DIAGNOSIS — Z23 Encounter for immunization: Secondary | ICD-10-CM

## 2018-12-13 DIAGNOSIS — Z1211 Encounter for screening for malignant neoplasm of colon: Secondary | ICD-10-CM

## 2018-12-13 DIAGNOSIS — I10 Essential (primary) hypertension: Secondary | ICD-10-CM | POA: Diagnosis not present

## 2018-12-13 NOTE — Addendum Note (Signed)
Addended by: Michaela Corner on: 12/13/2018 03:08 PM   Modules accepted: Orders

## 2018-12-13 NOTE — Progress Notes (Signed)
There were no vitals taken for this visit.   Subjective:   Patient ID: Nathan Ayala, male    DOB: 16-Jul-1945, 73 y.o.   MRN: [id number]  HPI: Nathan Ayala is a 73 y.o. male presenting on 12/13/2018 for Medical Management of Chronic Issues   HPI Hypertension Patient is currently on hydrochlorothiazide and Imdur, and their blood pressure today is 125/82. Patient denies any lightheadedness or dizziness. Patient denies headaches, blurred vision, chest pains, shortness of breath, or weakness. Denies any side effects from medication and is content with current medication.   Hyperlipidemia Patient is coming in for recheck of his hyperlipidemia. The patient is currently taking omega-3 and Crestor. They deny any issues with myalgias or history of liver damage from it. They deny any focal numbness or weakness or chest pain.   Patient has been dealing with the neuropathy after a shingles infection and has been taking gabapentin for it and feels like it has been working for the most part pretty well to control the nerve pain but it just not going away.  It is been 7 months since he first had the shingles.  Relevant past medical, surgical, family and social history reviewed and updated as indicated. Interim medical history since our last visit reviewed. Allergies and medications reviewed and updated.  Review of Systems  Constitutional: Negative for chills and fever.  Respiratory: Negative for shortness of breath and wheezing.   Cardiovascular: Negative for chest pain and leg swelling.  Musculoskeletal: Negative for back pain and gait problem.  Skin: Negative for rash.  Neurological: Negative for dizziness.  All other systems reviewed and are negative.   Per HPI unless specifically indicated above   Allergies as of 12/13/2018   No Known Allergies     Medication List       Accurate as of December 13, 2018  2:24 PM. If you have any questions, ask your nurse or doctor.        STOP  taking these medications   valACYclovir 1000 MG tablet Commonly known as: VALTREX Stopped by: Fransisca Kaufmann Dettinger, MD     TAKE these medications   aspirin 81 MG tablet Take 81 mg by mouth daily.   clopidogrel 75 MG tablet Commonly known as: PLAVIX Take 75 mg by mouth daily.   CoQ-10 200 MG Caps Take 1 capsule by mouth daily.   gabapentin 400 MG capsule Commonly known as: NEURONTIN Take 1 capsule (400 mg total) by mouth 3 (three) times daily.   hydrochlorothiazide 25 MG tablet Commonly known as: HYDRODIURIL Take 1 tablet (25 mg total) by mouth daily.   isosorbide mononitrate 30 MG 24 hr tablet Commonly known as: IMDUR Take 30 mg by mouth 2 (two) times daily.   nitroGLYCERIN 0.4 MG SL tablet Commonly known as: NITROSTAT DISSOLVE 1 TAB UNDER TOUNGE FOR CHEST PAIN. MAY REPEAT EVERY 5 MINUTES FOR 3 DOSES. IF NO RELIEF CALL 911 OR GO TO ER   Omega 3 1200 MG Caps Take 1,200-2,400 mg by mouth 3 (three) times daily. Takes 2 caps in am, 2 caps at lunch, 1 cap in evening   rosuvastatin 10 MG tablet Commonly known as: CRESTOR Take 10 mg by mouth daily.   vitamin B-12 1000 MCG tablet Commonly known as: CYANOCOBALAMIN Take 1,000 mcg by mouth daily.        Objective:   There were no vitals taken for this visit.  Wt Readings from Last 3 Encounters:  10/31/18 205 lb (93 kg)  08/09/18 209 lb 9.6 oz (95.1 kg)  05/26/18 207 lb 6.4 oz (94.1 kg)    Physical Exam Vitals signs and nursing note reviewed.  Constitutional:      General: He is not in acute distress.    Appearance: He is well-developed. He is not diaphoretic.  Eyes:     General: No scleral icterus.    Conjunctiva/sclera: Conjunctivae normal.  Neck:     Musculoskeletal: Neck supple.     Thyroid: No thyromegaly.  Cardiovascular:     Rate and Rhythm: Normal rate and regular rhythm.     Heart sounds: Normal heart sounds. No murmur.  Pulmonary:     Effort: Pulmonary effort is normal. No respiratory distress.      Breath sounds: Normal breath sounds. No wheezing.  Genitourinary:    Prostate: Normal.     Rectum: Normal.  Lymphadenopathy:     Cervical: No cervical adenopathy.  Skin:    General: Skin is warm and dry.     Findings: No rash.  Neurological:     Mental Status: He is alert and oriented to person, place, and time.     Coordination: Coordination normal.  Psychiatric:        Behavior: Behavior normal.    Patient has a healed over spot where he can see some scarring from the shingles on his left flank but no actual rash currently, no tenderness to palpation  Assessment & Plan:   Problem List Items Addressed This Visit      Cardiovascular and Mediastinum   Benign essential HTN   Relevant Orders   CBC with Differential/Platelet   CMP14+EGFR     Other   Pure hypercholesterolemia - Primary   Relevant Orders   Lipid panel    Other Visit Diagnoses    Prostate cancer screening       Relevant Orders   PSA, total and free   Colon cancer screening       Relevant Orders   Ambulatory referral to Gastroenterology      Continue medication for shingles neuropathy and continue cholesterol and blood pressure medication, will check blood work today. Follow up plan: No follow-ups on file.  Counseling provided for all of the vaccine components Orders Placed This Encounter  Procedures  . CBC with Differential/Platelet  . CMP14+EGFR  . Lipid panel  . PSA, total and free  . Ambulatory referral to Gastroenterology    Caryl Pina, MD [facility] 12/13/2018, 2:24 PM

## 2018-12-14 LAB — CMP14+EGFR
ALT: 26 IU/L (ref 0–44)
AST: 27 IU/L (ref 0–40)
Albumin/Globulin Ratio: 1.8 (ref 1.2–2.2)
Albumin: 4.3 g/dL (ref 3.7–4.7)
Alkaline Phosphatase: 75 IU/L (ref 39–117)
BUN/Creatinine Ratio: 13 (ref 10–24)
BUN: 17 mg/dL (ref 8–27)
Bilirubin Total: 3.2 mg/dL — ABNORMAL HIGH (ref 0.0–1.2)
CO2: 24 mmol/L (ref 20–29)
Calcium: 9 mg/dL (ref 8.6–10.2)
Chloride: 102 mmol/L (ref 96–106)
Creatinine, Ser: 1.36 mg/dL — ABNORMAL HIGH (ref 0.76–1.27)
GFR calc Af Amer: 58 mL/min/{1.73_m2} — ABNORMAL LOW (ref >59)
GFR calc non Af Amer: 51 mL/min/{1.73_m2} — ABNORMAL LOW (ref >59)
Globulin, Total: 2.4 g/dL (ref 1.5–4.5)
Glucose: 89 mg/dL (ref 65–99)
Potassium: 3.6 mmol/L (ref 3.5–5.2)
Sodium: 141 mmol/L (ref 134–144)
Total Protein: 6.7 g/dL (ref 6.0–8.5)

## 2018-12-14 LAB — LIPID PANEL
Chol/HDL Ratio: 3.1 ratio (ref 0.0–5.0)
Cholesterol, Total: 104 mg/dL (ref 100–199)
HDL: 34 mg/dL — ABNORMAL LOW (ref >39)
LDL Chol Calc (NIH): 49 mg/dL (ref 0–99)
Triglycerides: 112 mg/dL (ref 0–149)
VLDL Cholesterol Cal: 21 mg/dL (ref 5–40)

## 2018-12-14 LAB — CBC WITH DIFFERENTIAL/PLATELET
Basophils Absolute: 0 10*3/uL (ref 0.0–0.2)
Basos: 1 %
EOS (ABSOLUTE): 0.1 10*3/uL (ref 0.0–0.4)
Eos: 1 %
Hematocrit: 51.4 % — ABNORMAL HIGH (ref 37.5–51.0)
Hemoglobin: 16.7 g/dL (ref 13.0–17.7)
Immature Grans (Abs): 0 10*3/uL (ref 0.0–0.1)
Immature Granulocytes: 0 %
Lymphocytes Absolute: 2.8 10*3/uL (ref 0.7–3.1)
Lymphs: 36 %
MCH: 28.5 pg (ref 26.6–33.0)
MCHC: 32.5 g/dL (ref 31.5–35.7)
MCV: 88 fL (ref 79–97)
Monocytes Absolute: 0.7 10*3/uL (ref 0.1–0.9)
Monocytes: 9 %
Neutrophils Absolute: 4.3 10*3/uL (ref 1.4–7.0)
Neutrophils: 53 %
Platelets: 213 10*3/uL (ref 150–450)
RBC: 5.86 x10E6/uL — ABNORMAL HIGH (ref 4.14–5.80)
RDW: 13.8 % (ref 11.6–15.4)
WBC: 7.9 10*3/uL (ref 3.4–10.8)

## 2018-12-14 LAB — PSA, TOTAL AND FREE
PSA, Free Pct: 34.7 %
PSA, Free: 0.52 ng/mL
Prostate Specific Ag, Serum: 1.5 ng/mL (ref 0.0–4.0)

## 2018-12-22 ENCOUNTER — Ambulatory Visit (INDEPENDENT_AMBULATORY_CARE_PROVIDER_SITE_OTHER): Payer: Medicare Other | Admitting: *Deleted

## 2018-12-22 ENCOUNTER — Other Ambulatory Visit: Payer: Self-pay

## 2018-12-22 DIAGNOSIS — Z0000 Encounter for general adult medical examination without abnormal findings: Secondary | ICD-10-CM

## 2018-12-22 NOTE — Progress Notes (Addendum)
MEDICARE ANNUAL WELLNESS VISIT  12/22/2018  Telephone Visit Disclaimer This Medicare AWV was conducted by telephone due to national recommendations for restrictions regarding the COVID-19 Pandemic (e.g. social distancing).  I verified, using two identifiers, that I am speaking with Nathan Ayala or their authorized healthcare agent. I discussed the limitations, risks, security, and privacy concerns of performing an evaluation and management service by telephone and the potential availability of an in-person appointment in the future. The patient expressed understanding and agreed to proceed.  Subjective: Nathan Ayala is a 73 y.o. male patient of Dettinger, Fransisca Kaufmann, MD who had a Medicare Annual Wellness Visit today via telephone. Nathan Ayala and lives with their spouse. he has 3 children. he reports that he is socially active and does interact with friends/family regularly. he is minimally physically active and enjoys interacting with his church members.  Patient Care Team: Dettinger, Fransisca Kaufmann, MD as PCP - General (Family Medicine) Sueanne Margarita, MD as PCP - Cardiology (Cardiology)  Advanced Directives 12/22/2018 05/23/2018 09/28/2015 Does Patient Have a Medical Advance Directive? No No No Would patient like information on creating a medical advance directive? - - No - patient declined information   Hospital Utilization Over the Past 12 Months: # of hospitalizations or ER visits: 0 # of surgeries: 0  Review of Systems    Patient reports that his overall health is unchanged compared to last year.  History obtained from chart review and the patient  Patient Reported Readings (BP, Pulse, CBG, Weight, etc) none  Pain Assessment Pain : No/denies pain     Current Medications & Allergies (verified) Allergies as of 12/22/2018  No Known Allergies   Medication List    Accurate as of December 22, 2018  1:54 PM. If you have any questions, ask your nurse or  doctor.    aspirin 81 MG tablet Take 81 mg by mouth daily.  clopidogrel 75 MG tablet Commonly known as: PLAVIX Take 75 mg by mouth daily.  CoQ-10 200 MG Caps Take 1 capsule by mouth daily.  gabapentin 400 MG capsule Commonly known as: NEURONTIN Take 1 capsule (400 mg total) by mouth 3 (three) times daily.  hydrochlorothiazide 25 MG tablet Commonly known as: HYDRODIURIL Take 1 tablet (25 mg total) by mouth daily.  isosorbide mononitrate 30 MG 24 hr tablet Commonly known as: IMDUR Take 30 mg by mouth 2 (two) times daily.  nitroGLYCERIN 0.4 MG SL tablet Commonly known as: NITROSTAT DISSOLVE 1 TAB UNDER TOUNGE FOR CHEST PAIN. MAY REPEAT EVERY 5 MINUTES FOR 3 DOSES. IF NO RELIEF CALL 911 OR GO TO ER  Omega 3 1200 MG Caps Take 1,200-2,400 mg by mouth 3 (three) times daily. Takes 2 caps in am, 2 caps at lunch, 1 cap in evening  rosuvastatin 10 MG tablet Commonly known as: CRESTOR Take 10 mg by mouth daily.  vitamin B-12 1000 MCG tablet Commonly known as: CYANOCOBALAMIN Take 1,000 mcg by mouth daily.     History (reviewed): Past Medical History: Diagnosis Date . Atypical chest pain   Chronic Secondary to musculoskeletal disease of cervical spine followed by Dr. Jannifer Franklin . Benign essential HTN 07/30/2014 . Chronic chest wall pain 11/25/2016 . Coronary artery disease 08/2003  s/p inferior lateral myocardial infarction with PCI of Proximal RCA. He is also s/p PTCA stenting of Proximal LAD September 2006 . Edema extremities 08/01/2017 . Hyperlipidemia  . Stable angina [facility])   Past Surgical History: Procedure Laterality Date .  arthroscopic knee surgery   . CARDIAC CATHETERIZATION   . CORONARY ANGIOPLASTY   . heart attack   . KIDNEY STONE SURGERY   . REPAIR OF PERFORATED ULCER   . VASECTOMY    Family History Problem Relation Age of Onset . Kidney cancer Mother  . Hypertension Mother  . Diabetes Mother  . Cancer Mother  . Cancer Father   Social  History  Socioeconomic History . Marital status: Married   Spouse name: Not on file . Number of children: 3 . Years of education: Not on file . Highest education level: Master's degree (e.g., MA, MS, MEng, MEd, MSW, MBA) Occupational History . Occupation: Theme park manager Social Needs . Financial resource strain: Not hard at all . Food insecurity   Worry: Never true   Inability: Never true . Transportation needs   Medical: No   Non-medical: No Tobacco Use . Smoking status: Former Smoker   Packs/day: 1.50   Years: 10.00   Pack years: 15.00   Types: Cigarettes   Quit date: 03/01/1970   Years since quitting: 48.8 . Smokeless tobacco: Never Used Substance and Sexual Activity . Alcohol use: No   Alcohol/week: 0.0 standard drinks . Drug use: No . Sexual activity: Not Currently Lifestyle . Physical activity   Days per week: 0 days   Minutes per session: 0 min . Stress: Not at all Relationships . Social connections   Talks on phone: More than three times a week   Gets together: More than three times a week   Attends religious service: More than 4 times per year   Active member of club or organization: Yes   Attends meetings of clubs or organizations: More than 4 times per year   Relationship status: Married Other Topics Concern . Not on file Social History Narrative  53 years, 3 children, 3 grandchildren   Activities of Daily Living In your present state of health, do you have any difficulty performing the following activities: 12/22/2018 Hearing? N Vision? N Difficulty concentrating or making decisions? N Walking or climbing stairs? N Dressing or bathing? N Doing errands, shopping? N Preparing Food and eating ? N Using the Toilet? N In the past six months, have you accidently leaked urine? N Do you have problems with loss of bowel control? N Managing your Medications? N Managing your Finances? N Housekeeping or managing your Housekeeping? N Some recent data might be  hidden   Patient Education/ Literacy How often do you need to have someone help you when you read instructions, pamphlets, or other written materials from your doctor or pharmacy?: 1 - Never What is the last grade level you completed in school?: 12  Exercise Current Exercise Habits: The patient has a physically strenuous job, but has no regular exercise apart from work., Exercise limited by: None identified  Diet Patient reports consuming 3 meals a day and 0 snack(s) a day Patient reports that his primary diet is: Regular Patient reports that she does have regular access to food.   Depression Screen PHQ 2/9 Scores 12/22/2018 12/13/2018 08/09/2018 05/19/2018 04/04/2015 PHQ - 2 Score 0 0 0 0 0    Fall Risk Fall Risk  12/22/2018 12/13/2018 08/09/2018 04/04/2015 Falls in the past year? 0 0 0 No    Objective: Nathan Ayala seemed alert and oriented and he participated appropriately during our telephone visit.  Blood Pressure Weight BMI BP Readings from Last 3 Encounters: 08/09/18 122/81 05/26/18 (!) 144/81 05/23/18 137/87   Wt Readings from Last 3 Encounters: 10/31/18  205 lb (93 kg) 08/09/18 209 lb 9.6 oz (95.1 kg) 05/26/18 207 lb 6.4 oz (94.1 kg)   BMI Readings from Last 1 Encounters: 10/31/18 29.84 kg/m  *Unable to obtain current vital signs, weight, and BMI due to telephone visit type  Hearing/Vision  Taniela did not seem to have difficulty with hearing/understanding during the telephone conversation Reports that he has not had a formal eye exam by an eye care professional within the past year Reports that he has not had a formal hearing evaluation within the past year *Unable to fully assess hearing and vision during telephone visit type  Cognitive Function: 6CIT Screen 12/22/2018 What Year? 0 points What month? 0 points What Ayala? 0 points Count back from 20 0 points Months in reverse 0 points Repeat phrase 0 points Total Score 0  (Normal:0-7, Significant for  Dysfunction: >8)  Normal Cognitive Function Screening: Yes   Immunization & Health Maintenance Record Immunization History Administered Date(s) Administered . Fluad Quad(high Dose 65+) 12/13/2018 . Influenza, High Dose Seasonal PF 01/05/2018 . Pneumococcal Conjugate-13 12/13/2018   Health Maintenance Topic Date Due . COLONOSCOPY  10/09/1993 . TETANUS/TDAP  12/13/2019 (Originally 10/10/1964) . Hepatitis C Screening  12/13/2019 (Originally 07/27/1945) . PNA vac Low Risk Adult (2 of 2 - PPSV23) 12/13/2019 . INFLUENZA VACCINE  Completed      Assessment This is a routine wellness examination for [employer].  Health Maintenance: Due or Overdue Health Maintenance Due Topic Date Due . COLONOSCOPY  10/09/1993   Nathan Ayala does not need a referral for Community Assistance: Care Management:   no Social Work:    no Prescription Assistance:  no Nutrition/Diabetes Education:  no   Plan: Personalized Goals Goals Addressed           This Visit's Progress  . Patient Stated      He does not have any goals, he eats right, exercises frequently on the farm. 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    Personalized Health Maintenance & Screening Recommendations  Colorectal cancer screening  Lung Cancer Screening Recommended: no (Low Dose CT Chest recommended if Age 38-80 years, 30 pack-year currently smoking OR have quit w/in past 15 years) Hepatitis C Screening recommended: yes HIV Screening recommended: no  Advanced Directives: Written information was not prepared per patient's request.  Referrals & 0rders No orders of the defined types were placed in this encounter.   Follow-up Plan Follow-up with Dettinger, Fransisca Kaufmann, MD as planned  Pt is in overall good health. 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 He also works his farm land about twice a week and feels he gets plenty of exercise.  Chart says he is due for a colonoscopy. He states he had one 12/02/2016  with Dr. Earlean Shawl. Noted on chart to obtain ROI at next visit.   I have personally reviewed and noted the following in the patient's chart:   Medical and social history Use of alcohol, tobacco or illicit drugs  Current medications and supplements Functional ability and status Nutritional status Physical activity Advanced directives List of other physicians Hospitalizations, surgeries, and ER visits in previous 12 months Vitals Screenings to include cognitive, depression, and falls Referrals and appointments  In addition, I have reviewed and discussed with Nathan Ayala certain preventive protocols, quality metrics, and best practice recommendations. A written personalized care plan for preventive services as well as general preventive health recommendations is available and can be mailed to the patient at  his request.     Rana Snare, LPN  [id number]    I have reviewed and agree with the above AWV documentation.   Evelina Dun, FNP

## 2019-01-29 ENCOUNTER — Other Ambulatory Visit: Payer: Self-pay | Admitting: Cardiology

## 2019-02-02 ENCOUNTER — Other Ambulatory Visit: Payer: Self-pay | Admitting: Family Medicine

## 2019-02-02 DIAGNOSIS — B029 Zoster without complications: Secondary | ICD-10-CM

## 2019-02-02 DIAGNOSIS — B0223 Postherpetic polyneuropathy: Secondary | ICD-10-CM

## 2019-03-06 ENCOUNTER — Other Ambulatory Visit: Payer: Self-pay | Admitting: Family Medicine

## 2019-03-06 DIAGNOSIS — B0223 Postherpetic polyneuropathy: Secondary | ICD-10-CM

## 2019-03-06 DIAGNOSIS — B029 Zoster without complications: Secondary | ICD-10-CM

## 2019-03-09 ENCOUNTER — Other Ambulatory Visit: Payer: Self-pay | Admitting: Cardiology

## 2019-03-22 ENCOUNTER — Telehealth: Payer: Self-pay | Admitting: *Deleted

## 2019-03-22 NOTE — Telephone Encounter (Signed)
   [facility] Pre-operative Risk Assessment    Request for surgical clearance:  1. What type of surgery is being performed? COLONOSCOPY   2. When is this surgery scheduled? 04/05/19   3. What type of clearance is required (medical clearance vs. Pharmacy clearance to hold med vs. Both)? MEDICAL  4. Are there any medications that need to be held prior to surgery and how long? PLAVIX   5. Practice name and name of physician performing surgery? [facility]; DR. JEFFREY MEDOFF  6. What is your office phone number [phone]   7.   What is your office fax number [phone]  8.   Anesthesia type (None, local, MAC, general) ? NOT LISTED   Nathan Ayala 03/22/2019, 12:45 PM  _________________________________________________________________   (provider comments below)

## 2019-03-22 NOTE — Telephone Encounter (Signed)
Ok to hold plavix for procedure 

## 2019-03-22 NOTE — Telephone Encounter (Signed)
   Primary Cardiologist: Fransico Him, MD  Chart reviewed as part of pre-operative protocol coverage. Patient was contacted 03/22/2019 in reference to pre-operative risk assessment for pending surgery as outlined below.  Nathan Ayala was last seen on 10/31/2018 by Dr. Radford Pax via telemedicine.  Since that day, Nathan Ayala has done well. He has chronic stable angina with only occasional use of SL NTG. He has been stable over several years with no worsening.   Therefore, based on ACC/AHA guidelines, the patient would be at acceptable risk for the planned procedure without further cardiovascular testing.   Plavix can be held as needed for procedure.   I will route this recommendation to the requesting party via Epic fax function and remove from pre-op pool.  Please call with questions.  Daune Perch, NP 03/22/2019, 2:40 PM

## 2019-03-22 NOTE — Telephone Encounter (Signed)
Dr. Radford Pax, this pt has hx of inf/lat MI with PCI to RCA. Stent to prox LAD 2006. With chronic stable angina.  Normal myoview in 11/2015  Can Plavix be held for colonoscopy?  Please route response back to P CV DIV PREOP  Thanks, Gae Bon

## 2019-03-26 ENCOUNTER — Other Ambulatory Visit: Payer: Self-pay | Admitting: Cardiology

## 2019-04-05 DIAGNOSIS — K648 Other hemorrhoids: Secondary | ICD-10-CM | POA: Insufficient documentation

## 2019-04-05 DIAGNOSIS — K5730 Diverticulosis of large intestine without perforation or abscess without bleeding: Secondary | ICD-10-CM | POA: Diagnosis not present

## 2019-04-05 DIAGNOSIS — Z86010 Personal history of colonic polyps: Secondary | ICD-10-CM | POA: Diagnosis not present

## 2019-04-05 DIAGNOSIS — K5790 Diverticulosis of intestine, part unspecified, without perforation or abscess without bleeding: Secondary | ICD-10-CM | POA: Insufficient documentation

## 2019-04-05 DIAGNOSIS — Z1211 Encounter for screening for malignant neoplasm of colon: Secondary | ICD-10-CM | POA: Diagnosis not present

## 2019-04-05 DIAGNOSIS — Z800 Family history of malignant neoplasm of digestive organs: Secondary | ICD-10-CM | POA: Insufficient documentation

## 2019-04-07 ENCOUNTER — Other Ambulatory Visit: Payer: Self-pay | Admitting: Family Medicine

## 2019-04-07 DIAGNOSIS — B029 Zoster without complications: Secondary | ICD-10-CM

## 2019-04-09 ENCOUNTER — Other Ambulatory Visit: Payer: Self-pay | Admitting: Family Medicine

## 2019-04-09 DIAGNOSIS — B029 Zoster without complications: Secondary | ICD-10-CM

## 2019-04-09 DIAGNOSIS — B0223 Postherpetic polyneuropathy: Secondary | ICD-10-CM

## 2019-04-24 ENCOUNTER — Other Ambulatory Visit: Payer: Self-pay | Admitting: Cardiology

## 2019-05-08 ENCOUNTER — Other Ambulatory Visit: Payer: Self-pay | Admitting: Family Medicine

## 2019-05-08 DIAGNOSIS — B029 Zoster without complications: Secondary | ICD-10-CM

## 2019-05-08 DIAGNOSIS — B0223 Postherpetic polyneuropathy: Secondary | ICD-10-CM

## 2019-06-13 ENCOUNTER — Ambulatory Visit: Payer: Medicare Other | Admitting: Family Medicine

## 2019-06-15 ENCOUNTER — Ambulatory Visit (INDEPENDENT_AMBULATORY_CARE_PROVIDER_SITE_OTHER): Payer: Medicare Other

## 2019-06-15 ENCOUNTER — Other Ambulatory Visit: Payer: Self-pay

## 2019-06-15 ENCOUNTER — Encounter: Payer: Self-pay | Admitting: Family Medicine

## 2019-06-15 ENCOUNTER — Ambulatory Visit (INDEPENDENT_AMBULATORY_CARE_PROVIDER_SITE_OTHER): Payer: Medicare Other | Admitting: Family Medicine

## 2019-06-15 VITALS — BP 129/73 | HR 83 | Temp 97.1°F | Ht 69.5 in | Wt 211.5 lb

## 2019-06-15 DIAGNOSIS — M25551 Pain in right hip: Secondary | ICD-10-CM

## 2019-06-15 DIAGNOSIS — I10 Essential (primary) hypertension: Secondary | ICD-10-CM

## 2019-06-15 DIAGNOSIS — E7800 Pure hypercholesterolemia, unspecified: Secondary | ICD-10-CM

## 2019-06-15 DIAGNOSIS — B0223 Postherpetic polyneuropathy: Secondary | ICD-10-CM

## 2019-06-15 MED ORDER — ESOMEPRAZOLE MAGNESIUM 40 MG PO CPDR: 40.0000 mg | DELAYED_RELEASE_CAPSULE | Freq: Every day | ORAL | 3 refills | Status: DC

## 2019-06-15 NOTE — Progress Notes (Signed)
BP 129/73   Pulse 83   Temp (!) 97.1 F (36.2 C) (Temporal)   Ht 5' 9.5" (1.765 m)   Wt 211 lb 8 oz (95.9 kg)   BMI 30.79 kg/m    Subjective:   Patient ID: Nathan Ayala, male    DOB: 01-07-1946, 74 y.o.   MRN: [id number]  HPI: Nathan Ayala is a 74 y.o. male presenting on 06/15/2019 for Medical Management of Chronic Issues   HPI Hyperlipidemia Patient is coming in for recheck of his hyperlipidemia. The patient is currently taking Crestor and fish oil. They deny any issues with myalgias or history of liver damage from it. They deny any focal numbness or weakness or chest pain.   Hypertension Patient is currently on hydrochlorothiazide and Imdur, and their blood pressure today is 129/73. Patient denies any lightheadedness or dizziness. Patient denies headaches, blurred vision, chest pains, shortness of breath, or weakness. Denies any side effects from medication and is content with current medication.   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  He does good during the day, he is currently taking gabapentin for this three times daily.  He takes 400 mg in the morning and afternoon in the evening.  He says he wakes up around four or 5 in the morning because it is wearing off.  Relevant past medical, surgical, family and social history reviewed and updated as indicated. Interim medical history since our last visit reviewed. Allergies and medications reviewed and updated.  Review of Systems  Constitutional: Negative for chills and fever.  Eyes: Negative for visual disturbance.  Respiratory: Negative for shortness of breath and wheezing.   Cardiovascular: Negative for chest pain and leg swelling.  Musculoskeletal: Positive for arthralgias. Negative for  back pain and gait problem.  Skin: Negative for rash.  Neurological: Positive for numbness. Negative for dizziness, weakness and light-headedness.  All other systems reviewed and are negative.   Per HPI unless specifically indicated above   Allergies as of 06/15/2019   No Known Allergies     Medication List       Accurate as of June 15, 2019 11:37 AM. If you have any questions, ask your nurse or doctor.        aspirin 81 MG tablet Take 81 mg by mouth daily.   clopidogrel 75 MG tablet Commonly known as: PLAVIX Take 75 mg by mouth daily.   CoQ-10 200 MG Caps Take 1 capsule by mouth daily.   esomeprazole 40 MG capsule Commonly known as: NEXIUM Take 1 capsule (40 mg total) by mouth daily.   gabapentin 400 MG capsule Commonly known as: NEURONTIN TAKE ONE (1) CAPSULE THREE (3) TIMES EACH DAY   hydrochlorothiazide 25 MG tablet Commonly known as: HYDRODIURIL TAKE ONE (1) TABLET EACH DAY   isosorbide mononitrate 30 MG 24 hr tablet Commonly known as: IMDUR TAKE 1 TABLET TWICE A DAY   nitroGLYCERIN 0.4 MG SL tablet Commonly known as: NITROSTAT DISSOLVE 1 TABLET UNDER TONGUE FOR CHESTPAIN. MAY REPEAT EVERY 5 MINUTES FOR 3 DOSES. IF NO RELIEF CALL 911 OR GO TO ER   Omega 3 1200 MG Caps Take 1,200-2,400 mg by mouth 3 (three) times daily. Takes 2 caps in am, 2 caps at  lunch, 1 cap in evening   rosuvastatin 10 MG tablet Commonly known as: CRESTOR TAKE 1 TABLET DAILY   vitamin B-12 1000 MCG tablet Commonly known as: CYANOCOBALAMIN Take 1,000 mcg by mouth daily.        Objective:   BP 129/73   Pulse 83   Temp (!) 97.1 F (36.2 C) (Temporal)   Ht 5' 9.5" (1.765 m)   Wt 211 lb 8 oz (95.9 kg)   BMI 30.79 kg/m   Wt Readings from Last 3 Encounters:  06/15/19 211 lb 8 oz (95.9 kg)  10/31/18 205 lb (93 kg)  08/09/18 209 lb 9.6 oz (95.1 kg)    Physical Exam Vitals and nursing note reviewed.  Constitutional:      General: He is not in acute distress.     Appearance: He is well-developed. He is not diaphoretic.  Eyes:     General: No scleral icterus.    Conjunctiva/sclera: Conjunctivae normal.  Neck:     Thyroid: No thyromegaly.  Cardiovascular:     Rate and Rhythm: Normal rate and regular rhythm.     Heart sounds: Normal heart sounds. No murmur.  Pulmonary:     Effort: Pulmonary effort is normal. No respiratory distress.     Breath sounds: Normal breath sounds. No wheezing.  Musculoskeletal:        General: Tenderness present. Normal range of motion.     Cervical back: Neck supple.     Right hip: Tenderness (Anterior right tenderness and tenderness with all range of motion of the hip.) present. No deformity or crepitus. Normal range of motion. Normal strength.  Lymphadenopathy:     Cervical: No cervical adenopathy.  Skin:    General: Skin is warm and dry.     Findings: No rash.  Neurological:     Mental Status: He is alert and oriented to person, place, and time.     Coordination: Coordination normal.  Psychiatric:        Behavior: Behavior normal.       Assessment & Plan:   Problem List Items Addressed This Visit      Cardiovascular and Mediastinum   Benign essential HTN   Relevant Orders   CBC with Differential/Platelet   CMP14+EGFR     Nervous and Auditory   Shingles (herpes zoster) polyneuropathy   Relevant Orders   CBC with Differential/Platelet     Other   Pure hypercholesterolemia - Primary   Relevant Orders   Lipid panel    Other Visit Diagnoses    Right hip pain       Relevant Orders   DG HIP UNILAT W OR W/O PELVIS 2-3 VIEWS RIGHT    Blood pressure looks good today, will continue that, instructed him that he may play with the gabapentin a little bit to help him more in the evenings and take 1 in the morning and 2 in the evening instead of three a day, still a total of three a day but just more to in the evening to see if it helps him sleep better.  Right hip x-ray: No signs of acute bony abnormality,  shows some degenerative changes in the inferior aspect, likely osteoarthritis, await final read from radiology  Recommended Tylenol arthritis for his hip  Follow up plan: Return in about 6 months (around 12/15/2019), or if symptoms worsen or fail to improve, for Hypertension and cholesterol recheck.  Counseling provided for all of the vaccine components Orders Placed This Encounter  Procedures  .  DG HIP UNILAT W OR W/O PELVIS 2-3 VIEWS RIGHT  . CBC with Differential/Platelet  . CMP14+EGFR  . Lipid panel    Caryl Pina, MD [facility] 06/15/2019, 11:37 AM

## 2019-07-07 ENCOUNTER — Other Ambulatory Visit: Payer: Self-pay | Admitting: Cardiology

## 2019-07-10 ENCOUNTER — Other Ambulatory Visit: Payer: Self-pay | Admitting: Family Medicine

## 2019-07-10 DIAGNOSIS — B029 Zoster without complications: Secondary | ICD-10-CM

## 2019-07-10 DIAGNOSIS — B0223 Postherpetic polyneuropathy: Secondary | ICD-10-CM

## 2019-07-19 ENCOUNTER — Ambulatory Visit (INDEPENDENT_AMBULATORY_CARE_PROVIDER_SITE_OTHER): Payer: Medicare Other | Admitting: Physician Assistant

## 2019-07-19 ENCOUNTER — Other Ambulatory Visit: Payer: Self-pay

## 2019-07-19 ENCOUNTER — Encounter: Payer: Self-pay | Admitting: Physician Assistant

## 2019-07-19 VITALS — BP 133/81 | HR 62 | Temp 97.1°F | Ht 69.5 in | Wt 209.6 lb

## 2019-07-19 DIAGNOSIS — M25532 Pain in left wrist: Secondary | ICD-10-CM | POA: Diagnosis not present

## 2019-07-19 DIAGNOSIS — M25531 Pain in right wrist: Secondary | ICD-10-CM | POA: Diagnosis not present

## 2019-07-19 NOTE — Patient Instructions (Signed)
Wrist Pain, Adult There are many things that can cause wrist pain. Some common causes include:  An injury to the wrist area.  Overuse of the joint.  A condition that causes too much pressure to be put on a nerve in the wrist (carpal tunnel syndrome).  Wear and tear of the joints that happens as a person gets older (osteoarthritis).  Other types of arthritis. Sometimes, the cause of wrist pain is not known. Often, the pain goes away when you follow your doctor's instructions for helping pain at home, such as resting or icing your wrist. If your wrist pain does not go away, it is important to tell your doctor. Follow these instructions at home:  Rest the wrist area for 48 hours or more, or as long as told by your doctor.  If a splint or elastic bandage has been put on your wrist, use it as told by your doctor. ? Take off the splint or bandage only as told by your doctor. ? Loosen the splint or bandage if your fingers tingle, lose feeling (get numb), or turn cold or blue.  If directed, apply ice to the injured area: ? If you have a removable splint or elastic bandage, remove it as told by your doctor. ? Put ice in a plastic bag. ? Place a towel between your skin and the bag or between your splint or bandage and the bag. ? Leave the ice on for 20 minutes, 2-3 times a day.   Keep your arm raised (elevated) above the level of your heart while you are sitting or lying down.  Take over-the-counter and prescription medicines only as told by your doctor.  Keep all follow-up visits as told by your doctor. This is important. Contact a doctor if:  You have a sudden sharp pain in the wrist, hand, or arm that is different or new.  The swelling or bruising on your wrist or hand gets worse.  Your skin becomes red, gets a rash, or has open sores.  Your pain does not get better or it gets worse. Get help right away if:  You lose feeling in your fingers or hand.  Your fingers turn white,  very red, or cold and blue.  You cannot move your fingers.  You have a fever or chills. This information is not intended to replace advice given to you by your health care provider. Make sure you discuss any questions you have with your health care provider. Document Revised: 01/28/2017 Document Reviewed: 09/04/2015 Elsevier Patient Education  2020 [employer]vier Inc.    

## 2019-07-19 NOTE — Progress Notes (Signed)
  Subjective:     Patient ID: Nathan Ayala, male   DOB: 11/01/1945, 74 y.o.   MRN: [id number]  HPI Pt with 1 month hx of bilat wrist pain and stiffness R>L Sx will occasionally awaken at night Sx correspond to increase in typing/writing and weed eating No prev injury Pt R hand dominant Sx have improved with Tylenol Review of Systems  Constitutional: Negative.   Musculoskeletal: Positive for arthralgias and myalgias. Negative for joint swelling.       Objective:   Physical Exam Vitals and nursing note reviewed.  Constitutional:      Appearance: Normal appearance.  Neurological:     Mental Status: He is alert.   Good sensory to both hand No edema/ecchy to the wrists FROM of the wrist bilat Grip strength is equal bilat No thenar atrophy noted     Assessment:     1. Bilateral wrist pain        Plan:    Heat Ice OTC cock up wrist splint Tylenol due to Plavix use Nl course revived F/U prn *

## 2019-08-02 ENCOUNTER — Telehealth: Payer: Self-pay | Admitting: Family Medicine

## 2019-08-02 DIAGNOSIS — M25531 Pain in right wrist: Secondary | ICD-10-CM

## 2019-08-02 NOTE — Telephone Encounter (Signed)
  REFERRAL REQUEST Telephone Note 08/02/2019  What type of referral do you need? For wrist pain. Pt saw Osa Craver recently and he said his wrist was still hurting and wantst referral  Have you been seen at our office for this problem? yes (Advise that they may need an appointment with their PCP before a referral can be done)  Is there a particular doctor or location that you prefer? [facility]  Patient notified that referrals can take up to a week or longer to process. If they haven't heard anything within a week they should call back and speak with the referral department.

## 2019-08-06 NOTE — Telephone Encounter (Signed)
Placed referral to [facility] for the patient

## 2019-08-10 ENCOUNTER — Other Ambulatory Visit: Payer: Self-pay | Admitting: Family Medicine

## 2019-08-10 DIAGNOSIS — B0223 Postherpetic polyneuropathy: Secondary | ICD-10-CM

## 2019-08-10 DIAGNOSIS — B029 Zoster without complications: Secondary | ICD-10-CM

## 2019-09-14 ENCOUNTER — Other Ambulatory Visit: Payer: Self-pay | Admitting: Family Medicine

## 2019-09-14 DIAGNOSIS — B029 Zoster without complications: Secondary | ICD-10-CM

## 2019-09-14 DIAGNOSIS — B0223 Postherpetic polyneuropathy: Secondary | ICD-10-CM

## 2019-09-28 ENCOUNTER — Other Ambulatory Visit: Payer: Self-pay | Admitting: Cardiology

## 2019-10-17 ENCOUNTER — Other Ambulatory Visit: Payer: Self-pay | Admitting: Family Medicine

## 2019-10-17 DIAGNOSIS — B0223 Postherpetic polyneuropathy: Secondary | ICD-10-CM

## 2019-10-17 DIAGNOSIS — B029 Zoster without complications: Secondary | ICD-10-CM

## 2019-11-01 ENCOUNTER — Ambulatory Visit (INDEPENDENT_AMBULATORY_CARE_PROVIDER_SITE_OTHER): Payer: Medicare Other | Admitting: Cardiology

## 2019-11-01 ENCOUNTER — Other Ambulatory Visit: Payer: Self-pay

## 2019-11-01 ENCOUNTER — Encounter: Payer: Self-pay | Admitting: Cardiology

## 2019-11-01 VITALS — BP 135/87 | HR 59 | Ht 69.0 in | Wt 208.0 lb

## 2019-11-01 DIAGNOSIS — E7800 Pure hypercholesterolemia, unspecified: Secondary | ICD-10-CM

## 2019-11-01 DIAGNOSIS — R600 Localized edema: Secondary | ICD-10-CM

## 2019-11-01 DIAGNOSIS — I208 Other forms of angina pectoris: Secondary | ICD-10-CM

## 2019-11-01 DIAGNOSIS — I10 Essential (primary) hypertension: Secondary | ICD-10-CM | POA: Diagnosis not present

## 2019-11-01 DIAGNOSIS — I25119 Atherosclerotic heart disease of native coronary artery with unspecified angina pectoris: Secondary | ICD-10-CM

## 2019-11-01 NOTE — Progress Notes (Signed)
History of Present Illness:       Date:  11/01/2019   ID:  Nathan Ayala, DOB 1945/10/08, MRN [id number]   PCP:  Dettinger, Fransisca Kaufmann, MD  Cardiologist:  Fransico Him, MD  Electrophysiologist:  None   Chief Complaint:  CAD, HLD, HTN  History of Present Illness:    Nathan Ayala is a 74 y.o. male with a hx of ASCAD(s/p inferior lateral myocardial infarction with PCI of Proximal RCA. He is also s/p PTCA stenting of Proximal LAD September 2006), chronic chest wall pain, dyslipidemia and HTN. He has chronic stable angina that usually occurs if he works too hard out in the heat in his yard. It usually resolves with one sublingual nitroglycerin. His frequency and severity of chest pain have not changed since I saw him last. Nuclear stress test 11/2015 showed no inducible ischemia.  He is here today for followup and is doing well.  He still has chronic chest pain but not very often and has only had to take SL NTG about 4 times in the past year. He denies any  SOB, DOE, PND, orthopnea, dizziness (except standing up too fast), palpitations or syncope. He has chronic LE edema that is controlled on diuretics.  He is compliant with his meds and is tolerating meds with no SE.    Prior CV studies:   The following studies were reviewed today:  EKG  Past Medical History:  Diagnosis Date  . Atypical chest pain    Chronic Secondary to musculoskeletal disease of cervical spine followed by Dr. Jannifer Franklin  . Benign essential HTN 07/30/2014  . Chronic chest wall pain 11/25/2016  . Coronary artery disease 08/2003   s/p inferior lateral myocardial infarction with PCI of Proximal RCA. He is also s/p PTCA stenting of Proximal LAD September 2006  . Edema extremities 08/01/2017  . Hyperlipidemia   . Stable angina [facility])    Past Surgical History:  Procedure Laterality Date  . arthroscopic knee surgery    . CARDIAC CATHETERIZATION    . CORONARY ANGIOPLASTY    . heart attack    . KIDNEY STONE SURGERY    .  REPAIR OF PERFORATED ULCER    . VASECTOMY       Current Meds  Medication Sig  . aspirin 81 MG tablet Take 81 mg by mouth daily.  . clopidogrel (PLAVIX) 75 MG tablet TAKE 1 TABLET DAILY  . Coenzyme Q10 (COQ-10) 200 MG CAPS Take 1 capsule by mouth daily.  Marland Kitchen esomeprazole (NEXIUM) 40 MG capsule Take 1 capsule (40 mg total) by mouth daily.  Marland Kitchen gabapentin (NEURONTIN) 400 MG capsule TAKE ONE (1) CAPSULE THREE (3) TIMES EACH DAY  . hydrochlorothiazide (HYDRODIURIL) 25 MG tablet TAKE ONE (1) TABLET EACH DAY  . isosorbide mononitrate (IMDUR) 30 MG 24 hr tablet TAKE 1 TABLET TWICE A DAY  . nitroGLYCERIN (NITROSTAT) 0.4 MG SL tablet DISSOLVE 1 TABLET UNDER TONGUE FOR CHESTPAIN. MAY REPEAT EVERY 5 MINUTES FOR 3 DOSES. IF NO RELIEF CALL 911 OR GO TO ER  . Omega 3 1200 MG CAPS Take 1,200-2,400 mg by mouth 3 (three) times daily. Takes 2 caps in am, 2 caps at lunch, 1 cap in evening  . rosuvastatin (CRESTOR) 10 MG tablet Take 1 tablet (10 mg total) by mouth daily. Please keep upcoming appt in September with Dr. Radford Pax before anymore refills. Thank you  . vitamin B-12 (CYANOCOBALAMIN) 1000 MCG tablet Take 1,000 mcg by mouth daily.     Allergies:  Patient has no known allergies.   Social History   Tobacco Use  . Smoking status: Former Smoker    Packs/day: 1.50    Years: 10.00    Pack years: 15.00    Types: Cigarettes    Quit date: 03/01/1970    Years since quitting: 49.7  . Smokeless tobacco: Never Used  Vaping Use  . Vaping Use: Never used  Substance Use Topics  . Alcohol use: No    Alcohol/week: 0.0 standard drinks  . Drug use: No     Family Hx: The patient's family history includes Cancer in his father and mother; Diabetes in his mother; Hypertension in his mother; Kidney cancer in his mother.  ROS:   Please see the history of present illness.    none All other systems reviewed and are negative.   Labs/Other Tests and Data Reviewed:    Recent Labs: 12/13/2018: ALT 26; BUN 17;  Creatinine, Ser 1.36; Hemoglobin 16.7; Platelets 213; Potassium 3.6; Sodium 141   Recent Lipid Panel Lab Results  Component Value Date/Time   CHOL 104 12/13/2018 02:34 PM   TRIG 112 12/13/2018 02:34 PM   HDL 34 (L) 12/13/2018 02:34 PM   CHOLHDL 3.1 12/13/2018 02:34 PM   CHOLHDL 3.7 01/31/2015 10:44 AM   LDLCALC 49 12/13/2018 02:34 PM    Wt Readings from Last 3 Encounters:  11/01/19 208 lb (94.3 kg)  07/19/19 209 lb 9.6 oz (95.1 kg)  06/15/19 211 lb 8 oz (95.9 kg)     Objective:    Vital Signs:  BP 135/87   Pulse (!) 59   Ht 5\' 9"  (1.753 m)   Wt 208 lb (94.3 kg)   SpO2 97%   BMI 30.72 kg/m    GEN: Well nourished, well developed in no acute distress HEENT: Normal NECK: No JVD; No carotid bruits LYMPHATICS: No lymphadenopathy CARDIAC:RRR, no murmurs, rubs, gallops RESPIRATORY:  Clear to auscultation without rales, wheezing or rhonchi  ABDOMEN: Soft, non-tender, non-distended MUSCULOSKELETAL:  No edema; No deformity  SKIN: Warm and dry NEUROLOGIC:  Alert and oriented x 3 PSYCHIATRIC:  Normal affect   ASSESSMENT & PLAN:      1.  ASCAD with chronic stable angina - s/p inferior lateral myocardial infarction with PCI of Proximal RCA. He is also s/p PTCA stenting of Proximal LAD September 2006.   -he has chronic stable angina as well as noncardiac CP that is unchanged from last time I saw him -he has only taken SL NTG about 4 times since I saw him last.  -continue ASA, Plavix, Imdur 30mg  BID and statin  2.  HTN  -BP controlled on exam -Continue HCTZ 25mg  daily.   -check BMET   3.  HLD  -LDL goal < 70 -LDL was 49 last fall -repeat FLP and ALT today -Continue Crestor 10mg  daily and fish oil.  4.  LE edema  - well controlled on diuretics.   -continue HCTZ 25mg  daily   Medication Adjustments/Labs and Tests Ordered: Current medicines are reviewed at length with the patient today.  Concerns regarding medicines are outlined above.  Tests Ordered: No orders of the  defined types were placed in this encounter.  Medication Changes: No orders of the defined types were placed in this encounter.   Disposition:  Follow up in 1 year(s)  Signed, Fransico Him, MD  10/31/2018 10:26 AM    [facility]

## 2019-11-01 NOTE — Patient Instructions (Signed)
Medication Instructions:  Your physician recommends that you continue on your current medications as directed. Please refer to the Current Medication list given to you today.  *If you need a refill on your cardiac medications before your next appointment, please call your pharmacy* Labs: TODAY: FLP and CMET   Follow-Up: At [facility], you and your health needs are our priority.  As part of our continuing mission to provide you with exceptional heart care, we have created designated Provider Care Teams.  These Care Teams include your primary Cardiologist (physician) and Advanced Practice Providers (APPs -  Physician Assistants and Nurse Practitioners) who all work together to provide you with the care you need, when you need it.  Your next appointment:   1 year(s)  The format for your next appointment:   In Person  Provider:   You may see Fransico Him, MD or one of the following Advanced Practice Providers on your designated Care Team:    Melina Copa, PA-C  Ermalinda Barrios, PA-C

## 2019-11-01 NOTE — Addendum Note (Signed)
Addended by: Antonieta Iba on: 11/01/2019 01:21 PM   Modules accepted: Orders

## 2019-11-02 ENCOUNTER — Other Ambulatory Visit: Payer: Self-pay | Admitting: Cardiology

## 2019-11-02 LAB — COMPREHENSIVE METABOLIC PANEL
ALT: 27 IU/L (ref 0–44)
AST: 20 IU/L (ref 0–40)
Albumin/Globulin Ratio: 2.1 (ref 1.2–2.2)
Albumin: 4.4 g/dL (ref 3.7–4.7)
Alkaline Phosphatase: 69 IU/L (ref 48–121)
BUN/Creatinine Ratio: 11 (ref 10–24)
BUN: 14 mg/dL (ref 8–27)
Bilirubin Total: 2.2 mg/dL — ABNORMAL HIGH (ref 0.0–1.2)
CO2: 26 mmol/L (ref 20–29)
Calcium: 9.5 mg/dL (ref 8.6–10.2)
Chloride: 101 mmol/L (ref 96–106)
Creatinine, Ser: 1.25 mg/dL (ref 0.76–1.27)
GFR calc Af Amer: 64 mL/min/{1.73_m2} (ref >59)
GFR calc non Af Amer: 56 mL/min/{1.73_m2} — ABNORMAL LOW (ref >59)
Globulin, Total: 2.1 g/dL (ref 1.5–4.5)
Glucose: 97 mg/dL (ref 65–99)
Potassium: 4.1 mmol/L (ref 3.5–5.2)
Sodium: 140 mmol/L (ref 134–144)
Total Protein: 6.5 g/dL (ref 6.0–8.5)

## 2019-11-02 LAB — LIPID PANEL
Chol/HDL Ratio: 3.2 ratio (ref 0.0–5.0)
Cholesterol, Total: 113 mg/dL (ref 100–199)
HDL: 35 mg/dL — ABNORMAL LOW (ref >39)
LDL Chol Calc (NIH): 58 mg/dL (ref 0–99)
Triglycerides: 105 mg/dL (ref 0–149)
VLDL Cholesterol Cal: 20 mg/dL (ref 5–40)

## 2019-11-21 ENCOUNTER — Other Ambulatory Visit: Payer: Self-pay | Admitting: Family Medicine

## 2019-11-21 DIAGNOSIS — B0223 Postherpetic polyneuropathy: Secondary | ICD-10-CM

## 2019-11-21 DIAGNOSIS — B029 Zoster without complications: Secondary | ICD-10-CM

## 2019-12-20 ENCOUNTER — Other Ambulatory Visit: Payer: Self-pay

## 2019-12-20 ENCOUNTER — Ambulatory Visit (INDEPENDENT_AMBULATORY_CARE_PROVIDER_SITE_OTHER): Payer: Medicare Other | Admitting: Family Medicine

## 2019-12-20 ENCOUNTER — Encounter: Payer: Self-pay | Admitting: Family Medicine

## 2019-12-20 VITALS — BP 116/71 | HR 78 | Temp 96.7°F | Ht 69.0 in | Wt 201.0 lb

## 2019-12-20 DIAGNOSIS — E7800 Pure hypercholesterolemia, unspecified: Secondary | ICD-10-CM | POA: Diagnosis not present

## 2019-12-20 DIAGNOSIS — I10 Essential (primary) hypertension: Secondary | ICD-10-CM

## 2019-12-20 DIAGNOSIS — I208 Other forms of angina pectoris: Secondary | ICD-10-CM | POA: Diagnosis not present

## 2019-12-20 DIAGNOSIS — K219 Gastro-esophageal reflux disease without esophagitis: Secondary | ICD-10-CM | POA: Diagnosis not present

## 2019-12-20 DIAGNOSIS — Z23 Encounter for immunization: Secondary | ICD-10-CM | POA: Diagnosis not present

## 2019-12-20 NOTE — Progress Notes (Signed)
BP 116/71   Pulse 78   Temp (!) 96.7 F (35.9 C)   Ht 5\' 9"  (1.753 m)   Wt 201 lb (91.2 kg)   SpO2 95%   BMI 29.68 kg/m    Subjective:   Patient ID: Nathan Ayala, male    DOB: May 01, 1945, 74 y.o.   MRN: [id number]  HPI: Nathan Ayala is a 74 y.o. male presenting on 12/20/2019 for Medical Management of Chronic Issues   HPI Hyperlipidemia Patient is coming in for recheck of his hyperlipidemia. The patient is currently taking crestor. They deny any issues with myalgias or history of liver damage from it. They deny any focal numbness or weakness or chest pain.   GERD Patient is currently on nexium.  She denies any major symptoms or abdominal pain or belching or burping. She denies any blood in her stool or lightheadedness or dizziness.   Hypertension and CAD Patient is currently on imdur and hctz, and their blood pressure today is 116/71. Patient denies any lightheadedness or dizziness. Patient denies headaches, blurred vision, chest pains, shortness of breath, or weakness. Denies any side effects from medication and is content with current medication. Have a cardiologist that he follows up for his CAD.  Relevant past medical, surgical, family and social history reviewed and updated as indicated. Interim medical history since our last visit reviewed. Allergies and medications reviewed and updated.  Review of Systems  Constitutional: Negative for chills and fever.  Eyes: Negative for visual disturbance.  Respiratory: Negative for shortness of breath and wheezing.   Cardiovascular: Negative for chest pain and leg swelling.  Musculoskeletal: Negative for back pain and gait problem.  Skin: Negative for rash.  Neurological: Negative for dizziness, weakness and numbness.  All other systems reviewed and are negative.   Per HPI unless specifically indicated above   Allergies as of 12/20/2019   No Known Allergies     Medication List       Accurate as of December 20, 2019   1:18 PM. If you have any questions, ask your nurse or doctor.        aspirin 81 MG tablet Take 81 mg by mouth daily.   clopidogrel 75 MG tablet Commonly known as: PLAVIX TAKE 1 TABLET DAILY   CoQ-10 200 MG Caps Take 1 capsule by mouth daily.   D3-1000 25 MCG (1000 UT) capsule Generic drug: Cholecalciferol Take 1,000 Units by mouth daily.   esomeprazole 40 MG capsule Commonly known as: NEXIUM Take 1 capsule (40 mg total) by mouth daily.   gabapentin 400 MG capsule Commonly known as: NEURONTIN TAKE ONE (1) CAPSULE THREE (3) TIMES EACH DAY   hydrochlorothiazide 25 MG tablet Commonly known as: HYDRODIURIL TAKE ONE (1) TABLET EACH DAY   isosorbide mononitrate 30 MG 24 hr tablet Commonly known as: IMDUR TAKE 1 TABLET TWICE A DAY   nitroGLYCERIN 0.4 MG SL tablet Commonly known as: NITROSTAT DISSOLVE 1 TABLET UNDER TONGUE FOR CHESTPAIN. MAY REPEAT EVERY 5 MINUTES FOR 3 DOSES. IF NO RELIEF CALL 911 OR GO TO ER   Omega 3 1200 MG Caps Take 1,200-2,400 mg by mouth 3 (three) times daily. Takes 2 caps in am, 2 caps at lunch, 1 cap in evening   rosuvastatin 10 MG tablet Commonly known as: CRESTOR Take 1 tablet (10 mg total) by mouth daily. Please keep upcoming appt in September with Dr. Radford Pax before anymore refills. Thank you   vitamin B-12 1000 MCG tablet Commonly known as: CYANOCOBALAMIN Take  1,000 mcg by mouth daily.   vitamin C 1000 MG tablet Take 1,000 mg by mouth daily.   zinc gluconate 50 MG tablet Take 50 mg by mouth daily.        Objective:   BP 116/71   Pulse 78   Temp (!) 96.7 F (35.9 C)   Ht 5\' 9"  (1.753 m)   Wt 201 lb (91.2 kg)   SpO2 95%   BMI 29.68 kg/m   Wt Readings from Last 3 Encounters:  12/20/19 201 lb (91.2 kg)  11/01/19 208 lb (94.3 kg)  07/19/19 209 lb 9.6 oz (95.1 kg)    Physical Exam Vitals and nursing note reviewed.  Constitutional:      General: He is not in acute distress.    Appearance: He is well-developed. He is not  diaphoretic.  Eyes:     General: No scleral icterus.    Conjunctiva/sclera: Conjunctivae normal.  Neck:     Thyroid: No thyromegaly.  Cardiovascular:     Rate and Rhythm: Normal rate and regular rhythm.     Heart sounds: Normal heart sounds. No murmur heard.   Pulmonary:     Effort: Pulmonary effort is normal. No respiratory distress.     Breath sounds: Normal breath sounds. No wheezing.  Musculoskeletal:        General: Normal range of motion.     Cervical back: Neck supple.  Lymphadenopathy:     Cervical: No cervical adenopathy.  Skin:    General: Skin is warm and dry.     Findings: No rash.  Neurological:     Mental Status: He is alert and oriented to person, place, and time.     Coordination: Coordination normal.  Psychiatric:        Behavior: Behavior normal.     Results for orders placed or performed in visit on 11/01/19  Comprehensive metabolic panel  Result Value Ref Range   Glucose 97 65 - 99 mg/dL   BUN 14 8 - 27 mg/dL   Creatinine, Ser 1.25 0.76 - 1.27 mg/dL   GFR calc non Af Amer 56 (L) >59 mL/min/1.73   GFR calc Af Amer 64 >59 mL/min/1.73   BUN/Creatinine Ratio 11 10 - 24   Sodium 140 134 - 144 mmol/L   Potassium 4.1 3.5 - 5.2 mmol/L   Chloride 101 96 - 106 mmol/L   CO2 26 20 - 29 mmol/L   Calcium 9.5 8.6 - 10.2 mg/dL   Total Protein 6.5 6.0 - 8.5 g/dL   Albumin 4.4 3.7 - 4.7 g/dL   Globulin, Total 2.1 1.5 - 4.5 g/dL   Albumin/Globulin Ratio 2.1 1.2 - 2.2   Bilirubin Total 2.2 (H) 0.0 - 1.2 mg/dL   Alkaline Phosphatase 69 48 - 121 IU/L   AST 20 0 - 40 IU/L   ALT 27 0 - 44 IU/L  Lipid panel  Result Value Ref Range   Cholesterol, Total 113 100 - 199 mg/dL   Triglycerides 105 0 - 149 mg/dL   HDL 35 (L) >39 mg/dL   VLDL Cholesterol Cal 20 5 - 40 mg/dL   LDL Chol Calc (NIH) 58 0 - 99 mg/dL   Chol/HDL Ratio 3.2 0.0 - 5.0 ratio    Assessment & Plan:   Problem List Items Addressed This Visit      Cardiovascular and Mediastinum   Benign essential  HTN     Digestive   GERD (gastroesophageal reflux disease)     Other   Pure  hypercholesterolemia    Other Visit Diagnoses    Flu vaccine need    -  Primary   Relevant Orders   Flu Vaccine QUAD High Dose(Fluad) (Completed)      Continue current medications. Follow up plan: Return in about 6 months (around 06/19/2020), or if symptoms worsen or fail to improve, for htn and gerd.  Counseling provided for all of the vaccine components Orders Placed This Encounter  Procedures  . Flu Vaccine QUAD High Dose(Fluad)    Caryl Pina, MD [facility] 12/20/2019, 1:18 PM

## 2019-12-20 NOTE — Addendum Note (Signed)
Addended by: Alphonzo Dublin on: 12/20/2019 01:54 PM   Modules accepted: Orders

## 2019-12-21 ENCOUNTER — Other Ambulatory Visit: Payer: Self-pay | Admitting: Cardiology

## 2019-12-27 ENCOUNTER — Other Ambulatory Visit: Payer: Self-pay | Admitting: Family Medicine

## 2019-12-27 DIAGNOSIS — B0223 Postherpetic polyneuropathy: Secondary | ICD-10-CM

## 2019-12-27 DIAGNOSIS — B029 Zoster without complications: Secondary | ICD-10-CM

## 2019-12-31 ENCOUNTER — Other Ambulatory Visit: Payer: Self-pay | Admitting: Cardiology

## 2020-01-28 ENCOUNTER — Other Ambulatory Visit: Payer: Self-pay | Admitting: Cardiology

## 2020-01-28 NOTE — Telephone Encounter (Signed)
Pt's medication was sent to pt's pharmacy as requested. Confirmation received.  °

## 2020-01-31 ENCOUNTER — Other Ambulatory Visit: Payer: Self-pay | Admitting: *Deleted

## 2020-01-31 DIAGNOSIS — B0223 Postherpetic polyneuropathy: Secondary | ICD-10-CM

## 2020-01-31 DIAGNOSIS — B029 Zoster without complications: Secondary | ICD-10-CM

## 2020-01-31 MED ORDER — GABAPENTIN 400 MG PO CAPS: ORAL_CAPSULE | ORAL | 0 refills | Status: DC

## 2020-02-26 ENCOUNTER — Other Ambulatory Visit: Payer: Self-pay | Admitting: *Deleted

## 2020-02-26 DIAGNOSIS — B029 Zoster without complications: Secondary | ICD-10-CM

## 2020-02-26 DIAGNOSIS — B0223 Postherpetic polyneuropathy: Secondary | ICD-10-CM

## 2020-02-26 MED ORDER — GABAPENTIN 400 MG PO CAPS: ORAL_CAPSULE | ORAL | 0 refills | Status: DC

## 2020-04-14 ENCOUNTER — Encounter: Payer: Self-pay | Admitting: *Deleted

## 2020-06-19 ENCOUNTER — Ambulatory Visit: Payer: Medicare Other | Admitting: Family Medicine

## 2020-06-24 ENCOUNTER — Other Ambulatory Visit: Payer: Self-pay

## 2020-06-24 ENCOUNTER — Ambulatory Visit (INDEPENDENT_AMBULATORY_CARE_PROVIDER_SITE_OTHER): Payer: Medicare Other | Admitting: Family Medicine

## 2020-06-24 ENCOUNTER — Encounter: Payer: Self-pay | Admitting: Family Medicine

## 2020-06-24 VITALS — BP 113/74 | HR 70 | Ht 69.0 in | Wt 200.0 lb

## 2020-06-24 DIAGNOSIS — I10 Essential (primary) hypertension: Secondary | ICD-10-CM | POA: Diagnosis not present

## 2020-06-24 DIAGNOSIS — B029 Zoster without complications: Secondary | ICD-10-CM | POA: Diagnosis not present

## 2020-06-24 DIAGNOSIS — N529 Male erectile dysfunction, unspecified: Secondary | ICD-10-CM | POA: Diagnosis not present

## 2020-06-24 DIAGNOSIS — E7800 Pure hypercholesterolemia, unspecified: Secondary | ICD-10-CM | POA: Diagnosis not present

## 2020-06-24 DIAGNOSIS — B0223 Postherpetic polyneuropathy: Secondary | ICD-10-CM | POA: Diagnosis not present

## 2020-06-24 MED ORDER — ESOMEPRAZOLE MAGNESIUM 40 MG PO CPDR
40.0000 mg | DELAYED_RELEASE_CAPSULE | Freq: Every day | ORAL | 3 refills | Status: DC
Start: 2020-06-24 — End: 2021-08-12

## 2020-06-24 MED ORDER — SILDENAFIL CITRATE 20 MG PO TABS: 20.0000 mg | ORAL_TABLET | ORAL | 1 refills | Status: DC | PRN

## 2020-06-24 MED ORDER — GABAPENTIN 400 MG PO CAPS
400.0000 mg | ORAL_CAPSULE | Freq: Two times a day (BID) | ORAL | 3 refills | Status: DC
Start: 2020-06-24 — End: 2020-07-01

## 2020-06-24 NOTE — Progress Notes (Signed)
BP 113/74   Pulse 70   Ht 5' 9" (1.753 m)   Wt 200 lb (90.7 kg)   SpO2 96%   BMI 29.53 kg/m    Subjective:   Patient ID: Nathan Ayala, male    DOB: 04/23/1945, 75 y.o.   MRN: [id number]  HPI: Charlee Whitebread is a 74 y.o. male presenting on 06/24/2020 for Medical Management of Chronic Issues and Hyperlipidemia   HPI Hyperlipidemia Patient is coming in for recheck of his hyperlipidemia. The patient is currently taking Crestor and fish oil. They deny any issues with myalgias or history of liver damage from it. They deny any focal numbness or weakness or chest pain.   Hypertension Patient is currently on Imdur and hydrochlorothiazide and Plavix for CAD, and their blood pressure today is 113/74. Patient denies any lightheadedness or dizziness. Patient denies headaches, blurred vision, chest pains, shortness of breath, or weakness. Denies any side effects from medication and is content with current medication.   GERD Patient is currently on Nexium.  He has been having some indigestion and belching and burping at times and feels like the over-the-counter Nexium is not doing as well, he could not state the dose  Neuropathy due to shingles Patient feels like he has been able to lower his gabapentin and not needing as high-dose numbness in the twice a day and seems to manage the neuropathy burning and tingling that he has from shingles around his face.  He feels like is doing pretty well.  Relevant past medical, surgical, family and social history reviewed and updated as indicated. Interim medical history since our last visit reviewed. Allergies and medications reviewed and updated.  Review of Systems  Constitutional: Negative for chills and fever.  Eyes: Negative for visual disturbance.  Respiratory: Negative for shortness of breath and wheezing.   Cardiovascular: Negative for chest pain and leg swelling.  Gastrointestinal: Positive for abdominal distention and nausea. Negative for  abdominal pain, blood in stool, constipation, diarrhea and vomiting.  Musculoskeletal: Negative for back pain and gait problem.  Skin: Negative for rash.  Neurological: Positive for numbness. Negative for dizziness and weakness.  All other systems reviewed and are negative.   Per HPI unless specifically indicated above   Allergies as of 06/24/2020   No Known Allergies     Medication List       Accurate as of June 24, 2020  1:49 PM. If you have any questions, ask your nurse or doctor.        aspirin 81 MG tablet Take 81 mg by mouth daily.   clopidogrel 75 MG tablet Commonly known as: PLAVIX TAKE 1 TABLET DAILY   CoQ-10 200 MG Caps Take 1 capsule by mouth daily.   D3-1000 25 MCG (1000 UT) capsule Generic drug: Cholecalciferol Take 1,000 Units by mouth daily.   esomeprazole 40 MG capsule Commonly known as: NEXIUM Take 1 capsule (40 mg total) by mouth daily.   gabapentin 400 MG capsule Commonly known as: NEURONTIN Take 1 capsule (400 mg total) by mouth 2 (two) times daily. TAKE ONE (1) CAPSULE THREE (3) TIMES EACH DAY What changed:   how much to take  how to take this  when to take this Changed by: Fransisca Kaufmann Dettinger, MD   hydrochlorothiazide 25 MG tablet Commonly known as: HYDRODIURIL TAKE ONE (1) TABLET EACH DAY   isosorbide mononitrate 30 MG 24 hr tablet Commonly known as: IMDUR TAKE 1 TABLET TWICE A DAY   nitroGLYCERIN 0.4 MG  SL tablet Commonly known as: NITROSTAT DISSOLVE 1 TABLET UNDER TONGUE FOR CHESTPAIN. MAY REPEAT EVERY 5 MINUTES FOR 3 DOSES. IF NO RELIEF CALL 911 OR GO TO ER   Omega 3 1200 MG Caps Take 1,200-2,400 mg by mouth 3 (three) times daily. Takes 2 caps in am, 2 caps at lunch, 1 cap in evening   rosuvastatin 10 MG tablet Commonly known as: CRESTOR Take 1 tablet (10 mg total) by mouth daily.   sildenafil 20 MG tablet Commonly known as: REVATIO Take 1-4 tablets (20-80 mg total) by mouth as needed. Started by: Fransisca Kaufmann Jaret Coppedge,  MD   vitamin B-12 1000 MCG tablet Commonly known as: CYANOCOBALAMIN Take 1,000 mcg by mouth daily.   vitamin C 1000 MG tablet Take 1,000 mg by mouth daily.   zinc gluconate 50 MG tablet Take 50 mg by mouth daily.        Objective:   BP 113/74   Pulse 70   Ht 5' 9" (1.753 m)   Wt 200 lb (90.7 kg)   SpO2 96%   BMI 29.53 kg/m   Wt Readings from Last 3 Encounters:  06/24/20 200 lb (90.7 kg)  12/20/19 201 lb (91.2 kg)  11/01/19 208 lb (94.3 kg)    Physical Exam Vitals and nursing note reviewed.  Constitutional:      General: He is not in acute distress.    Appearance: He is well-developed. He is not diaphoretic.  Eyes:     General: No scleral icterus.    Conjunctiva/sclera: Conjunctivae normal.  Neck:     Thyroid: No thyromegaly.  Cardiovascular:     Rate and Rhythm: Normal rate and regular rhythm.     Heart sounds: Normal heart sounds. No murmur heard.   Pulmonary:     Effort: Pulmonary effort is normal. No respiratory distress.     Breath sounds: Normal breath sounds. No wheezing.  Abdominal:     General: Abdomen is flat. Bowel sounds are normal. There is no distension.     Tenderness: There is no abdominal tenderness. There is no guarding or rebound.  Musculoskeletal:        General: Normal range of motion.     Cervical back: Neck supple.  Lymphadenopathy:     Cervical: No cervical adenopathy.  Skin:    General: Skin is warm and dry.     Findings: No rash.  Neurological:     Mental Status: He is alert and oriented to person, place, and time.     Coordination: Coordination normal.  Psychiatric:        Behavior: Behavior normal.       Assessment & Plan:   Problem List Items Addressed This Visit      Cardiovascular and Mediastinum   Benign essential HTN   Relevant Medications   sildenafil (REVATIO) 20 MG tablet   Other Relevant Orders   CBC with Differential/Platelet   CMP14+EGFR   Lipid panel     Nervous and Auditory   Shingles (herpes  zoster) polyneuropathy   Relevant Medications   gabapentin (NEURONTIN) 400 MG capsule     Other   Pure hypercholesterolemia - Primary   Relevant Medications   sildenafil (REVATIO) 20 MG tablet   Other Relevant Orders   CBC with Differential/Platelet   CMP14+EGFR   Lipid panel    Other Visit Diagnoses    Herpes zoster without complication       Relevant Medications   gabapentin (NEURONTIN) 400 MG capsule   Erectile dysfunction,  unspecified erectile dysfunction type        Gave patient a prescription for generic Viagra for the erectile dysfunction.  Patient has self reduced the gabapentin down to twice a day and doing well on it so sent the prescription to reflect as such.  Sent prescription Nexium to see if that helps better than over-the-counter whenever he was buying.  His over-the-counter was likely a lower dose.  Follow up plan: Return in about 6 months (around 12/24/2020), or if symptoms worsen or fail to improve, for Hypertension and cholesterol and neuropathy.  Counseling provided for all of the vaccine components Orders Placed This Encounter  Procedures  . CBC with Differential/Platelet  . CMP14+EGFR  . Lipid panel    Caryl Pina, MD [facility] 06/24/2020, 1:49 PM

## 2020-06-25 LAB — CMP14+EGFR
ALT: 31 IU/L (ref 0–44)
AST: 21 IU/L (ref 0–40)
Albumin/Globulin Ratio: 2.2 (ref 1.2–2.2)
Albumin: 4.7 g/dL (ref 3.7–4.7)
Alkaline Phosphatase: 81 IU/L (ref 44–121)
BUN/Creatinine Ratio: 14 (ref 10–24)
BUN: 18 mg/dL (ref 8–27)
Bilirubin Total: 3 mg/dL — ABNORMAL HIGH (ref 0.0–1.2)
CO2: 25 mmol/L (ref 20–29)
Calcium: 9.9 mg/dL (ref 8.6–10.2)
Chloride: 101 mmol/L (ref 96–106)
Creatinine, Ser: 1.3 mg/dL — ABNORMAL HIGH (ref 0.76–1.27)
Globulin, Total: 2.1 g/dL (ref 1.5–4.5)
Glucose: 95 mg/dL (ref 65–99)
Potassium: 4 mmol/L (ref 3.5–5.2)
Sodium: 141 mmol/L (ref 134–144)
Total Protein: 6.8 g/dL (ref 6.0–8.5)
eGFR: 57 mL/min/{1.73_m2} — ABNORMAL LOW (ref >59)

## 2020-06-25 LAB — CBC WITH DIFFERENTIAL/PLATELET
Basophils Absolute: 0 10*3/uL (ref 0.0–0.2)
Basos: 1 %
EOS (ABSOLUTE): 0.1 10*3/uL (ref 0.0–0.4)
Eos: 1 %
Hematocrit: 50.8 % (ref 37.5–51.0)
Hemoglobin: 17.1 g/dL (ref 13.0–17.7)
Immature Grans (Abs): 0 10*3/uL (ref 0.0–0.1)
Immature Granulocytes: 0 %
Lymphocytes Absolute: 3.4 10*3/uL — ABNORMAL HIGH (ref 0.7–3.1)
Lymphs: 38 %
MCH: 29.3 pg (ref 26.6–33.0)
MCHC: 33.7 g/dL (ref 31.5–35.7)
MCV: 87 fL (ref 79–97)
Monocytes Absolute: 0.8 10*3/uL (ref 0.1–0.9)
Monocytes: 9 %
Neutrophils Absolute: 4.6 10*3/uL (ref 1.4–7.0)
Neutrophils: 51 %
Platelets: 198 10*3/uL (ref 150–450)
RBC: 5.84 x10E6/uL — ABNORMAL HIGH (ref 4.14–5.80)
RDW: 13.6 % (ref 11.6–15.4)
WBC: 8.8 10*3/uL (ref 3.4–10.8)

## 2020-06-25 LAB — LIPID PANEL
Chol/HDL Ratio: 3.2 ratio (ref 0.0–5.0)
Cholesterol, Total: 112 mg/dL (ref 100–199)
HDL: 35 mg/dL — ABNORMAL LOW (ref >39)
LDL Chol Calc (NIH): 55 mg/dL (ref 0–99)
Triglycerides: 124 mg/dL (ref 0–149)
VLDL Cholesterol Cal: 22 mg/dL (ref 5–40)

## 2020-07-01 ENCOUNTER — Other Ambulatory Visit: Payer: Self-pay

## 2020-07-01 DIAGNOSIS — B029 Zoster without complications: Secondary | ICD-10-CM

## 2020-07-01 DIAGNOSIS — B0223 Postherpetic polyneuropathy: Secondary | ICD-10-CM

## 2020-07-01 MED ORDER — GABAPENTIN 400 MG PO CAPS: 400.0000 mg | ORAL_CAPSULE | Freq: Three times a day (TID) | ORAL | 3 refills | Status: DC

## 2020-07-29 ENCOUNTER — Other Ambulatory Visit: Payer: Self-pay | Admitting: Cardiology

## 2020-09-18 ENCOUNTER — Other Ambulatory Visit: Payer: Self-pay | Admitting: Cardiology

## 2020-10-22 ENCOUNTER — Other Ambulatory Visit: Payer: Self-pay | Admitting: Neurology

## 2020-10-22 MED ORDER — ISOSORBIDE MONONITRATE ER 30 MG PO TB24: 30.0000 mg | ORAL_TABLET | Freq: Two times a day (BID) | ORAL | 0 refills | Status: DC

## 2020-10-22 MED ORDER — CLOPIDOGREL BISULFATE 75 MG PO TABS: 75.0000 mg | ORAL_TABLET | Freq: Every day | ORAL | 0 refills | Status: DC

## 2020-10-22 MED ORDER — ROSUVASTATIN CALCIUM 10 MG PO TABS: 10.0000 mg | ORAL_TABLET | Freq: Every day | ORAL | 0 refills | Status: DC

## 2020-10-23 ENCOUNTER — Other Ambulatory Visit: Payer: Self-pay | Admitting: Cardiology

## 2020-11-07 ENCOUNTER — Other Ambulatory Visit: Payer: Self-pay

## 2020-11-07 ENCOUNTER — Encounter: Payer: Self-pay | Admitting: Cardiology

## 2020-11-07 ENCOUNTER — Ambulatory Visit (INDEPENDENT_AMBULATORY_CARE_PROVIDER_SITE_OTHER): Payer: Medicare Other | Admitting: Cardiology

## 2020-11-07 VITALS — BP 130/76 | HR 58 | Ht 69.0 in | Wt 206.8 lb

## 2020-11-07 DIAGNOSIS — R600 Localized edema: Secondary | ICD-10-CM

## 2020-11-07 DIAGNOSIS — I25119 Atherosclerotic heart disease of native coronary artery with unspecified angina pectoris: Secondary | ICD-10-CM | POA: Diagnosis not present

## 2020-11-07 DIAGNOSIS — E7800 Pure hypercholesterolemia, unspecified: Secondary | ICD-10-CM

## 2020-11-07 DIAGNOSIS — I10 Essential (primary) hypertension: Secondary | ICD-10-CM | POA: Diagnosis not present

## 2020-11-07 MED ORDER — ISOSORBIDE MONONITRATE ER 30 MG PO TB24: 30.0000 mg | ORAL_TABLET | Freq: Two times a day (BID) | ORAL | 3 refills | Status: DC

## 2020-11-07 MED ORDER — CLOPIDOGREL BISULFATE 75 MG PO TABS: 75.0000 mg | ORAL_TABLET | Freq: Every day | ORAL | 3 refills | Status: DC

## 2020-11-07 MED ORDER — HYDROCHLOROTHIAZIDE 25 MG PO TABS: ORAL_TABLET | ORAL | 3 refills | Status: DC

## 2020-11-07 MED ORDER — ROSUVASTATIN CALCIUM 10 MG PO TABS: 10.0000 mg | ORAL_TABLET | Freq: Every day | ORAL | 3 refills | Status: DC

## 2020-11-07 NOTE — Addendum Note (Signed)
Addended by: Nuala Alpha on: 11/07/2020 02:09 PM   Modules accepted: Orders

## 2020-11-07 NOTE — Patient Instructions (Addendum)
Medication Instructions:   STOP TAKING REVATIO NOW  *If you need a refill on your cardiac medications before your next appointment, please call your pharmacy*   Lab Work:  TODAY--BMET  If you have labs (blood work) drawn today and your tests are completely normal, you will receive your results only by: [place] (if you have MyChart) OR A paper copy in the mail If you have any lab test that is abnormal or we need to change your treatment, we will call you to review the results.   Follow-Up: At [facility], you and your health needs are our priority.  As part of our continuing mission to provide you with exceptional heart care, we have created designated Provider Care Teams.  These Care Teams include your primary Cardiologist (physician) and Advanced Practice Providers (APPs -  Physician Assistants and Nurse Practitioners) who all work together to provide you with the care you need, when you need it.  We recommend signing up for the patient portal called "MyChart".  Sign up information is provided on this After Visit Summary.  MyChart is used to connect with patients for Virtual Visits (Telemedicine).  Patients are able to view lab/test results, encounter notes, upcoming appointments, etc.  Non-urgent messages can be sent to your provider as well.   To learn more about what you can do with MyChart, go to [email or web address].    Your next appointment:   1 year(s)  The format for your next appointment:   In Person  Provider:   Fransico Him, MD

## 2020-11-07 NOTE — Progress Notes (Signed)
History of Present Illness:       Date:  11/07/2020   ID:  [name], DOB 11/10/1945, MRN [id number]   PCP:  Dettinger, Fransisca Kaufmann, MD  Cardiologist:  Fransico Him, MD  Electrophysiologist:  None   Chief Complaint:  CAD, HLD, HTN  History of Present Illness:    Nathan Ayala is a 75 y.o. male with a hx of ASCAD (s/p inferior lateral myocardial infarction with PCI of Proximal RCA. He is also s/p PTCA stenting of Proximal LAD September 2006), chronic chest wall pain, dyslipidemia and HTN. He has chronic stable angina that usually occurs if he works too hard out in the heat in his yard.  It usually resolves with one sublingual nitroglycerin.   Nuclear stress test 11/2015 showed no inducible ischemia.  He is here today for followup and is doing well.  He denies any chest pain or pressure, SOB, DOE, PND, orthopnea, LE edema, dizziness, palpitations or syncope. He is compliant with his meds and is tolerating meds with no SE.    Prior CV studies:   The following studies were reviewed today:  EKG  Past Medical History:  Diagnosis Date   Atypical chest pain    Chronic Secondary to musculoskeletal disease of cervical spine followed by Dr. Jannifer Franklin   Benign essential HTN 07/30/2014   Chronic chest wall pain 11/25/2016   Coronary artery disease 08/2003   s/p inferior lateral myocardial infarction with PCI of Proximal RCA. He is also s/p PTCA stenting of Proximal LAD September 2006   Edema extremities 08/01/2017   Hyperlipidemia    Stable angina (HCC)    Past Surgical History:  Procedure Laterality Date   arthroscopic knee surgery     CARDIAC CATHETERIZATION     CORONARY ANGIOPLASTY     heart attack     KIDNEY STONE SURGERY     REPAIR OF PERFORATED ULCER     VASECTOMY       Current Meds  Medication Sig   Ascorbic Acid (VITAMIN C) 1000 MG tablet Take 1,000 mg by mouth daily.   aspirin 81 MG tablet Take 81 mg by mouth daily.   Cholecalciferol (D3-1000) 25 MCG (1000 UT) capsule  Take 1,000 Units by mouth daily.   clopidogrel (PLAVIX) 75 MG tablet Take 1 tablet (75 mg total) by mouth daily.   Coenzyme Q10 (COQ-10) 200 MG CAPS Take 1 capsule by mouth daily.   esomeprazole (NEXIUM) 40 MG capsule Take 1 capsule (40 mg total) by mouth daily.   gabapentin (NEURONTIN) 400 MG capsule Take 1 capsule (400 mg total) by mouth 3 (three) times daily.   hydrochlorothiazide (HYDRODIURIL) 25 MG tablet TAKE ONE (1) TABLET EACH DAY   isosorbide mononitrate (IMDUR) 30 MG 24 hr tablet Take 1 tablet (30 mg total) by mouth 2 (two) times daily.   nitroGLYCERIN (NITROSTAT) 0.4 MG SL tablet DISSOLVE 1 TABLET UNDER TONGUE FOR CHESTPAIN. MAY REPEAT EVERY 5 MINUTES FOR 3 DOSES. IF NO RELIEF CALL 911 OR GO TO ER   Omega 3 1200 MG CAPS Take 1,200-2,400 mg by mouth 3 (three) times daily. Takes 2 caps in am, 2 caps at lunch, 1 cap in evening   rosuvastatin (CRESTOR) 10 MG tablet Take 1 tablet (10 mg total) by mouth daily.   sildenafil (REVATIO) 20 MG tablet Take 1-4 tablets (20-80 mg total) by mouth as needed.   vitamin B-12 (CYANOCOBALAMIN) 1000 MCG tablet Take 1,000 mcg by mouth daily.   zinc gluconate 50  MG tablet Take 50 mg by mouth daily.     Allergies:   Patient has no known allergies.   Social History   Tobacco Use   Smoking status: Former    Packs/day: 1.50    Years: 10.00    Pack years: 15.00    Types: Cigarettes    Quit date: 03/01/1970    Years since quitting: 50.7   Smokeless tobacco: Never  Vaping Use   Vaping Use: Never used  Substance Use Topics   Alcohol use: No    Alcohol/week: 0.0 standard drinks   Drug use: No     Family Hx: The patient's family history includes Cancer in his father and mother; Diabetes in his mother; Hypertension in his mother; Kidney cancer in his mother.  ROS:   Please see the history of present illness.    none All other systems reviewed and are negative.   Labs/Other Tests and Data Reviewed:    Recent Labs: 06/24/2020: ALT 31; BUN 18;  Creatinine, Ser 1.30; Hemoglobin 17.1; Platelets 198; Potassium 4.0; Sodium 141   Recent Lipid Panel Lab Results  Component Value Date/Time   CHOL 112 06/24/2020 01:52 PM   TRIG 124 06/24/2020 01:52 PM   HDL 35 (L) 06/24/2020 01:52 PM   CHOLHDL 3.2 06/24/2020 01:52 PM   CHOLHDL 3.7 01/31/2015 10:44 AM   LDLCALC 55 06/24/2020 01:52 PM    Wt Readings from Last 3 Encounters:  11/07/20 206 lb 12.8 oz (93.8 kg)  06/24/20 200 lb (90.7 kg)  12/20/19 201 lb (91.2 kg)     Objective:    Vital Signs:  BP 130/76   Pulse (!) 58   Ht '5\' 9"'$  (1.753 m)   Wt 206 lb 12.8 oz (93.8 kg)   SpO2 97%   BMI 30.54 kg/m    GEN: Well nourished, well developed in no acute distress HEENT: Normal NECK: No JVD; No carotid bruits LYMPHATICS: No lymphadenopathy CARDIAC:RRR, no murmurs, rubs, gallops RESPIRATORY:  Clear to auscultation without rales, wheezing or rhonchi  ABDOMEN: Soft, non-tender, non-distended MUSCULOSKELETAL:  No edema; No deformity  SKIN: Warm and dry NEUROLOGIC:  Alert and oriented x 3 PSYCHIATRIC:  Normal affect    EKG was performed in the office and shows sinus bradycardia at 58bpm with non specific T wave abnormality ASSESSMENT & PLAN:      1.  ASCAD with chronic stable angina - s/p inferior lateral myocardial infarction with PCI of Proximal RCA. He is also s/p PTCA stenting of Proximal LAD September 2006.   -he has chronic stable angina as well as noncardiac CP and this is very stable on medical therapy>>he has not had any angina or had to take any SL NTG in over a year -Continue prescription drug management with ASA '81mg'$  daily, Imdur '30mg'$  BID, Plavix '75mg'$  daily and statin>refilled  -he has been taking Cialis that his PCP prescribed and I instructed him to stop taking it due to severe interaction with his Imdur that he needs to take  2.  HTN  -BP is well controlled on exam today -Continue prescription drug management with HCTZ '25mg'$  daily>refilled  -check BMET   3.  HLD   -LDL goal < 70 -I have personally reviewed and interpreted outside labs performed by patient's PCP which showed LDL 55, HDL 35, ALT 31 in April 2022  -Continue prescription drug management with Crestor '10mg'$  daily and fish oil > refilled   4.  LE edema  -controlled on diuretics -continue HCTZ '25mg'$  daily  Medication Adjustments/Labs and Tests Ordered: Current medicines are reviewed at length with the patient today.  Concerns regarding medicines are outlined above.  Tests Ordered: No orders of the defined types were placed in this encounter.  Medication Changes: No orders of the defined types were placed in this encounter.   Disposition:  Follow up in 1 year(s)  Signed, Fransico Him, MD  10/31/2018 10:26 AM    [facility]

## 2020-11-08 LAB — BASIC METABOLIC PANEL
BUN/Creatinine Ratio: 13 (ref 10–24)
BUN: 17 mg/dL (ref 8–27)
CO2: 25 mmol/L (ref 20–29)
Calcium: 9.9 mg/dL (ref 8.6–10.2)
Chloride: 99 mmol/L (ref 96–106)
Creatinine, Ser: 1.29 mg/dL — ABNORMAL HIGH (ref 0.76–1.27)
Glucose: 99 mg/dL (ref 65–99)
Potassium: 4 mmol/L (ref 3.5–5.2)
Sodium: 140 mmol/L (ref 134–144)
eGFR: 57 mL/min/{1.73_m2} — ABNORMAL LOW (ref >59)

## 2020-12-01 ENCOUNTER — Ambulatory Visit: Payer: Medicare Other

## 2020-12-09 ENCOUNTER — Ambulatory Visit (INDEPENDENT_AMBULATORY_CARE_PROVIDER_SITE_OTHER): Payer: Medicare Other

## 2020-12-09 VITALS — Ht 69.0 in | Wt 200.0 lb

## 2020-12-09 DIAGNOSIS — Z0000 Encounter for general adult medical examination without abnormal findings: Secondary | ICD-10-CM | POA: Diagnosis not present

## 2020-12-09 NOTE — Patient Instructions (Signed)
Nathan Ayala , Thank you for taking time to come for your Medicare Wellness Visit. I appreciate your ongoing commitment to your health goals. Please review the following plan we discussed and let me know if I can assist you in the future.   Screening recommendations/referrals: Colonoscopy: Done 04/05/2019 -  Repeat in 3 years  Recommended yearly ophthalmology/optometry visit for glaucoma screening and checkup Recommended yearly dental visit for hygiene and checkup  Vaccinations: Influenza vaccine: Done 12/20/2019 - Repeat annually  Pneumococcal vaccine: Done 12/13/2018 & 12/20/2019 Tdap vaccine: Due. Every 10 years Shingles vaccine: Due.    Covid-19: Declined  Advanced directives: Advance directive discussed with you today. Even though you declined this today, please call our office should you change your mind, and we can give you the proper paperwork for you to fill out.   Conditions/risks identified: Aim for 30 minutes of exercise or brisk walking each day, drink 6-8 glasses of water and eat lots of fruits and vegetables.   Next appointment: Follow up in one year for your annual wellness visit.   Preventive Care 77 Years and Older, Male  Preventive care refers to lifestyle choices and visits with your health care provider that can promote health and wellness. What does preventive care include? A yearly physical exam. This is also called an annual well check. Dental exams once or twice a year. Routine eye exams. Ask your health care provider how often you should have your eyes checked. Personal lifestyle choices, including: Daily care of your teeth and gums. Regular physical activity. Eating a healthy diet. Avoiding tobacco and drug use. Limiting alcohol use. Practicing safe sex. Taking low doses of aspirin every day. Taking vitamin and mineral supplements as recommended by your health care provider. What happens during an annual well check? The services and screenings done by your  health care provider during your annual well check will depend on your age, overall health, lifestyle risk factors, and family history of disease. Counseling  Your health care provider may ask you questions about your: Alcohol use. Tobacco use. Drug use. Emotional well-being. Home and relationship well-being. Sexual activity. Eating habits. History of falls. Memory and ability to understand (cognition). Work and work Statistician. Screening  You may have the following tests or measurements: Height, weight, and BMI. Blood pressure. Lipid and cholesterol levels. These may be checked every 5 years, or more frequently if you are over 66 years old. Skin check. Lung cancer screening. You may have this screening every year starting at age 26 if you have a 30-pack-year history of smoking and currently smoke or have quit within the past 15 years. Fecal occult blood test (FOBT) of the stool. You may have this test every year starting at age 22. Flexible sigmoidoscopy or colonoscopy. You may have a sigmoidoscopy every 5 years or a colonoscopy every 10 years starting at age 50. Prostate cancer screening. Recommendations will vary depending on your family history and other risks. Hepatitis C blood test. Hepatitis B blood test. Sexually transmitted disease (STD) testing. Diabetes screening. This is done by checking your blood sugar (glucose) after you have not eaten for a while (fasting). You may have this done every 1-3 years. Abdominal aortic aneurysm (AAA) screening. You may need this if you are a current or former smoker. Osteoporosis. You may be screened starting at age 59 if you are at high risk. Talk with your health care provider about your test results, treatment options, and if necessary, the need for more tests. Vaccines  Your health care provider may recommend certain vaccines, such as: Influenza vaccine. This is recommended every year. Tetanus, diphtheria, and acellular pertussis  (Tdap, Td) vaccine. You may need a Td booster every 10 years. Zoster vaccine. You may need this after age 65. Pneumococcal 13-valent conjugate (PCV13) vaccine. One dose is recommended after age 2. Pneumococcal polysaccharide (PPSV23) vaccine. One dose is recommended after age 70. Talk to your health care provider about which screenings and vaccines you need and how often you need them. This information is not intended to replace advice given to you by your health care provider. Make sure you discuss any questions you have with your health care provider. Document Released: 03/14/2015 Document Revised: 11/05/2015 Document Reviewed: 12/17/2014 Elsevier Interactive Patient Education  2017 [place] Prevention in the Home Falls can cause injuries. They can happen to people of all ages. There are many things you can do to make your home safe and to help prevent falls. What can I do on the outside of my home? Regularly fix the edges of walkways and driveways and fix any cracks. Remove anything that might make you trip as you walk through a door, such as a raised step or threshold. Trim any bushes or trees on the path to your home. Use bright outdoor lighting. Clear any walking paths of anything that might make someone trip, such as rocks or tools. Regularly check to see if handrails are loose or broken. Make sure that both sides of any steps have handrails. Any raised decks and porches should have guardrails on the edges. Have any leaves, snow, or ice cleared regularly. Use sand or salt on walking paths during winter. Clean up any spills in your garage right away. This includes oil or grease spills. What can I do in the bathroom? Use night lights. Install grab bars by the toilet and in the tub and shower. Do not use towel bars as grab bars. Use non-skid mats or decals in the tub or shower. If you need to sit down in the shower, use a plastic, non-slip stool. Keep the floor dry. Clean  up any water that spills on the floor as soon as it happens. Remove soap buildup in the tub or shower regularly. Attach bath mats securely with double-sided non-slip rug tape. Do not have throw rugs and other things on the floor that can make you trip. What can I do in the bedroom? Use night lights. Make sure that you have a light by your bed that is easy to reach. Do not use any sheets or blankets that are too big for your bed. They should not hang down onto the floor. Have a firm chair that has side arms. You can use this for support while you get dressed. Do not have throw rugs and other things on the floor that can make you trip. What can I do in the kitchen? Clean up any spills right away. Avoid walking on wet floors. Keep items that you use a lot in easy-to-reach places. If you need to reach something above you, use a strong step stool that has a grab bar. Keep electrical cords out of the way. Do not use floor polish or wax that makes floors slippery. If you must use wax, use non-skid floor wax. Do not have throw rugs and other things on the floor that can make you trip. What can I do with my stairs? Do not leave any items on the stairs. Make sure that there are  handrails on both sides of the stairs and use them. Fix handrails that are broken or loose. Make sure that handrails are as long as the stairways. Check any carpeting to make sure that it is firmly attached to the stairs. Fix any carpet that is loose or worn. Avoid having throw rugs at the top or bottom of the stairs. If you do have throw rugs, attach them to the floor with carpet tape. Make sure that you have a light switch at the top of the stairs and the bottom of the stairs. If you do not have them, ask someone to add them for you. What else can I do to help prevent falls? Wear shoes that: Do not have high heels. Have rubber bottoms. Are comfortable and fit you well. Are closed at the toe. Do not wear sandals. If you  use a stepladder: Make sure that it is fully opened. Do not climb a closed stepladder. Make sure that both sides of the stepladder are locked into place. Ask someone to hold it for you, if possible. Clearly mark and make sure that you can see: Any grab bars or handrails. First and last steps. Where the edge of each step is. Use tools that help you move around (mobility aids) if they are needed. These include: Canes. Walkers. Scooters. Crutches. Turn on the lights when you go into a dark area. Replace any light bulbs as soon as they burn out. Set up your furniture so you have a clear path. Avoid moving your furniture around. If any of your floors are uneven, fix them. If there are any pets around you, be aware of where they are. Review your medicines with your doctor. Some medicines can make you feel dizzy. This can increase your chance of falling. Ask your doctor what other things that you can do to help prevent falls. This information is not intended to replace advice given to you by your health care provider. Make sure you discuss any questions you have with your health care provider. Document Released: 12/12/2008 Document Revised: 07/24/2015 Document Reviewed: 03/22/2014 Elsevier Interactive Patient Education  2017 [employer].

## 2020-12-09 NOTE — Progress Notes (Signed)
Subjective:   Nathan Ayala. is a 75 y.o. male who presents for Medicare Annual/Subsequent preventive examination.  Virtual Visit via Telephone Note  I connected with  [name] on 12/09/20 at  9:00 AM EDT by telephone and verified that I am speaking with the correct person using two identifiers.  Location: Patient: Home Provider: [facility] Persons participating in the virtual visit: patient/Nurse Health Advisor   I discussed the limitations, risks, security and privacy concerns of performing an evaluation and management service by telephone and the availability of in person appointments. The patient expressed understanding and agreed to proceed.  Interactive audio and video telecommunications were attempted between this nurse and patient, however failed, due to patient having technical difficulties OR patient did not have access to video capability.  We continued and completed visit with audio only.  Some vital signs may be absent or patient reported.   Caralynn Gelber E Yeiden Frenkel, LPN   Review of Systems     Cardiac Risk Factors include: advanced age (>58men, >54 women);male gender;dyslipidemia;hypertension     Objective:    Today's Vitals   12/09/20 0901  Weight: 200 lb (90.7 kg)  Height: 5\' 9"  (1.753 m)   Body mass index is 29.53 kg/m.  Advanced Directives 12/09/2020 12/22/2018 05/23/2018 09/28/2015  Does Patient Have a Medical Advance Directive? No No No No  Would patient like information on creating a medical advance directive? No - Patient declined - - No - patient declined information    Current Medications (verified) Outpatient Encounter Medications as of 12/09/2020  Medication Sig   Ascorbic Acid (VITAMIN C) 1000 MG tablet Take 1,000 mg by mouth daily.   aspirin 81 MG tablet Take 81 mg by mouth daily.   Cholecalciferol (D3-1000) 25 MCG (1000 UT) capsule Take 1,000 Units by mouth daily.   clopidogrel (PLAVIX) 75 MG tablet Take 1 tablet (75 mg total) by mouth daily.    Coenzyme Q10 (COQ-10) 200 MG CAPS Take 1 capsule by mouth daily.   esomeprazole (NEXIUM) 40 MG capsule Take 1 capsule (40 mg total) by mouth daily.   gabapentin (NEURONTIN) 400 MG capsule Take 1 capsule (400 mg total) by mouth 3 (three) times daily.   hydrochlorothiazide (HYDRODIURIL) 25 MG tablet TAKE ONE (1) TABLET EACH DAY   isosorbide mononitrate (IMDUR) 30 MG 24 hr tablet Take 1 tablet (30 mg total) by mouth 2 (two) times daily.   nitroGLYCERIN (NITROSTAT) 0.4 MG SL tablet DISSOLVE 1 TABLET UNDER TONGUE FOR CHESTPAIN. MAY REPEAT EVERY 5 MINUTES FOR 3 DOSES. IF NO RELIEF CALL 911 OR GO TO ER   Omega 3 1200 MG CAPS Take 1,200-2,400 mg by mouth 3 (three) times daily. Takes 2 caps in am, 2 caps at lunch, 1 cap in evening   rosuvastatin (CRESTOR) 10 MG tablet Take 1 tablet (10 mg total) by mouth daily.   vitamin B-12 (CYANOCOBALAMIN) 1000 MCG tablet Take 1,000 mcg by mouth daily.   zinc gluconate 50 MG tablet Take 50 mg by mouth daily.   No facility-administered encounter medications on file as of 12/09/2020.    Allergies (verified) Patient has no known allergies.   History: Past Medical History:  Diagnosis Date   Atypical chest pain    Chronic Secondary to musculoskeletal disease of cervical spine followed by Dr. Jannifer Franklin   Benign essential HTN 07/30/2014   Chronic chest wall pain 11/25/2016   Coronary artery disease 08/2003   s/p inferior lateral myocardial infarction with PCI of Proximal RCA. He  is also s/p PTCA stenting of Proximal LAD September 2006   Edema extremities 08/01/2017   Hyperlipidemia    Stable angina (HCC)    Past Surgical History:  Procedure Laterality Date   arthroscopic knee surgery     CARDIAC CATHETERIZATION     CORONARY ANGIOPLASTY     heart attack     KIDNEY STONE SURGERY     REPAIR OF PERFORATED ULCER     VASECTOMY     Family History  Problem Relation Age of Onset   Kidney cancer Mother    Hypertension Mother    Diabetes Mother    Cancer Mother     Cancer Father    Social History   Socioeconomic History   Marital status: Married    Spouse name: Judson Roch   Number of children: 3   Years of education: Not on file   Highest education level: Master's degree (e.g., MA, MS, MEng, MEd, MSW, MBA)  Occupational History   Occupation: pastor  Tobacco Use   Smoking status: Former    Packs/day: 1.50    Years: 10.00    Pack years: 15.00    Types: Cigarettes    Quit date: 03/01/1970    Years since quitting: 50.8   Smokeless tobacco: Never  Vaping Use   Vaping Use: Never used  Substance and Sexual Activity   Alcohol use: No    Alcohol/week: 0.0 standard drinks   Drug use: No   Sexual activity: Not Currently  Other Topics Concern   Not on file  Social History Narrative   51 years, 3 children, 3 grandchildren   He is a Environmental education officer. 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   Social Determinants of Health   Financial Resource Strain: Low Risk    Difficulty of Paying Living Expenses: Not hard at all  Food Insecurity: No Food Insecurity   Worried About Charity fundraiser in the Last Year: Never true   Arboriculturist in the Last Year: Never true  Transportation Needs: No Transportation Needs   Lack of Transportation (Medical): No   Lack of Transportation (Non-Medical): No  Physical Activity: Insufficiently Active   Days of Exercise per Week: 7 days   Minutes of Exercise per Session: 20 min  Stress: No Stress Concern Present   Feeling of Stress : Not at all  Social Connections: Socially Integrated   Frequency of Communication with Friends and Family: More than three times a week   Frequency of Social Gatherings with Friends and Family: More than three times a week   Attends Religious Services: More than 4 times per year   Active Member of [employer] or Organizations: Yes   Attends Music therapist: More than 4 times per year   Marital Status: Married    Tobacco  Counseling Counseling given: Not Answered   Clinical Intake:  Pre-visit preparation completed: Yes  Pain : No/denies pain     BMI - recorded: 29.53 Nutritional Status: BMI 25 -29 Overweight Nutritional Risks: None Diabetes: No  How often do you need to have someone help you when you read instructions, pamphlets, or other written materials from your doctor or pharmacy?: 1 - Never  Diabetic? No  Interpreter Needed?: No  Information entered by :: Harpreet Pompey, LPN   Activities of Daily Living In your present state of health, do you have any difficulty performing the following activities: 12/09/2020  Hearing?  N  Vision? N  Difficulty concentrating or making decisions? N  Walking or climbing stairs? N  Dressing or bathing? N  Doing errands, shopping? N  Preparing Food and eating ? N  Using the Toilet? N  In the past six months, have you accidently leaked urine? N  Do you have problems with loss of bowel control? N  Managing your Medications? N  Managing your Finances? N  Housekeeping or managing your Housekeeping? N  Some recent data might be hidden    Patient Care Team: Dettinger, Fransisca Kaufmann, MD as PCP - General (Family Medicine) Sueanne Margarita, MD as PCP - Cardiology (Cardiology)  Indicate any recent Medical Services you may have received from other than [facility] providers in the past year (date may be approximate).     Assessment:   This is a routine wellness examination for Haydan.  Hearing/Vision screen Hearing Screening - Comments:: Denies hearing difficulties  Vision Screening - Comments:: Wears reading glasses prn only - behind on annual eye exams - last went to [facility] in [place]  Dietary issues and exercise activities discussed: Current Exercise Habits: Home exercise routine, Type of exercise: walking;Other - see comments (farm work, odd jobs at [employer]), Time (Minutes): 20, Frequency (Times/Week): 7, Weekly Exercise (Minutes/Week): 140, Intensity:  Moderate, Exercise limited by: neurologic condition(s)   Goals Addressed             This Visit's Progress    Patient Stated   On track    He does not have any goals, he eats right, exercises frequently on the farm. 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       Depression Screen PHQ 2/9 Scores 12/09/2020 06/24/2020 12/20/2019 07/19/2019 06/15/2019 12/22/2018 12/13/2018  PHQ - 2 Score 0 0 0 0 0 0 0  PHQ- 9 Score - - - 0 - - -    Fall Risk Fall Risk  12/09/2020 06/24/2020 12/20/2019 07/19/2019 06/15/2019  Falls in the past year? 0 0 0 1 1  Number falls in past yr: 0 - - 0 0  Injury with Fall? 0 - - 0 0  Risk for fall due to : Medication side effect - - History of fall(s) No Fall Risks  Follow up Falls prevention discussed - - Falls evaluation completed Falls evaluation completed    [place]:  Any stairs in or around the home? Yes  If so, are there any without handrails? Yes  - front porch has 4-5 steps without handrail Home free of loose throw rugs in walkways, pet beds, electrical cords, etc? Yes  Adequate lighting in your home to reduce risk of falls? Yes   ASSISTIVE DEVICES UTILIZED TO PREVENT FALLS:  Life alert? No  Use of a cane, walker or w/c? No  Grab bars in the bathroom? No  Shower chair or bench in shower? No  Elevated toilet seat or a handicapped toilet? No   TIMED UP AND GO:  Was the test performed? No . Telephonic visit  Cognitive Function: Normal cognitive status assessed by direct observation by this Nurse Health Advisor. No abnormalities found.      6CIT Screen 12/22/2018  What Year? 0 points  What month? 0 points  What time? 0 points  Count back from 20 0 points  Months in reverse 0 points  Repeat phrase 0 points  Total Score 0    Immunizations Immunization History  Administered Date(s) Administered   Fluad Quad(high Dose  65+) 12/13/2018, 12/20/2019   Influenza, High Dose Seasonal PF 01/05/2018    Pneumococcal Conjugate-13 12/13/2018   Pneumococcal Polysaccharide-23 12/20/2019    TDAP status: Due, Education has been provided regarding the importance of this vaccine. Advised may receive this vaccine at local pharmacy or Health Dept. Aware to provide a copy of the vaccination record if obtained from local pharmacy or Health Dept. Verbalized acceptance and understanding.  Flu Vaccine status: Due, Education has been provided regarding the importance of this vaccine. Advised may receive this vaccine at local pharmacy or Health Dept. Aware to provide a copy of the vaccination record if obtained from local pharmacy or Health Dept. Verbalized acceptance and understanding.  Pneumococcal vaccine status: Up to date  Covid-19 vaccine status: Declined, Education has been provided regarding the importance of this vaccine but patient still declined. Advised may receive this vaccine at local pharmacy or Health Dept.or vaccine clinic. Aware to provide a copy of the vaccination record if obtained from local pharmacy or Health Dept. Verbalized acceptance and understanding.  Qualifies for Shingles Vaccine? Yes   Zostavax completed No   Shingrix Completed?: No.    Education has been provided regarding the importance of this vaccine. Patient has been advised to call insurance company to determine out of pocket expense if they have not yet received this vaccine. Advised may also receive vaccine at local pharmacy or Health Dept. Verbalized acceptance and understanding.  Screening Tests Health Maintenance  Topic Date Due   Zoster Vaccines- Shingrix (1 of 2) Never done   INFLUENZA VACCINE  09/29/2020   TETANUS/TDAP  12/19/2020 (Originally 10/10/1964)   COVID-19 Vaccine (1) 12/19/2020 (Originally 04/11/1946)   Hepatitis C Screening  12/19/2020 (Originally 10/09/1963)   COLONOSCOPY (Pts 45-110yrs Insurance coverage will need to be confirmed)  04/04/2022   HPV VACCINES  Aged Out    Health Maintenance  Health  Maintenance Due  Topic Date Due   Zoster Vaccines- Shingrix (1 of 2) Never done   INFLUENZA VACCINE  09/29/2020    Colorectal cancer screening: Type of screening: Colonoscopy. Completed 04/05/2019. Repeat every 3 years  Lung Cancer Screening: (Low Dose CT Chest recommended if Age 51-80 years, 30 pack-year currently smoking OR have quit w/in 15years.) does not qualify.   Additional Screening:  Hepatitis C Screening: does qualify; DUE  Vision Screening: Recommended annual ophthalmology exams for early detection of glaucoma and other disorders of the eye. Is the patient up to date with their annual eye exam?  No  Who is the provider or what is the name of the office in which the patient attends annual eye exams? [facility] If pt is not established with a provider, would they like to be referred to a provider to establish care? No .   Dental Screening: Recommended annual dental exams for proper oral hygiene  Community Resource Referral / Chronic Care Management: CRR required this visit?  No   CCM required this visit?  No      Plan:     I have personally reviewed and noted the following in the patient's chart:   Medical and social history Use of alcohol, tobacco or illicit drugs  Current medications and supplements including opioid prescriptions. Patient is not currently taking opioid prescriptions. Functional ability and status Nutritional status Physical activity Advanced directives List of other physicians Hospitalizations, surgeries, and ER visits in previous 12 months Vitals Screenings to include cognitive, depression, and falls Referrals and appointments  In addition, I have reviewed and discussed with patient certain  preventive protocols, quality metrics, and best practice recommendations. A written personalized care plan for preventive services as well as general preventive health recommendations were provided to patient.     Sandrea Hammond,  LPN   [id number]   Nurse Notes: None

## 2020-12-20 DIAGNOSIS — S0500XA Injury of conjunctiva and corneal abrasion without foreign body, unspecified eye, initial encounter: Secondary | ICD-10-CM | POA: Diagnosis not present

## 2020-12-20 DIAGNOSIS — S0181XA Laceration without foreign body of other part of head, initial encounter: Secondary | ICD-10-CM | POA: Diagnosis not present

## 2020-12-20 DIAGNOSIS — Z7902 Long term (current) use of antithrombotics/antiplatelets: Secondary | ICD-10-CM | POA: Diagnosis not present

## 2020-12-20 DIAGNOSIS — S0990XA Unspecified injury of head, initial encounter: Secondary | ICD-10-CM | POA: Diagnosis not present

## 2020-12-20 DIAGNOSIS — S0993XA Unspecified injury of face, initial encounter: Secondary | ICD-10-CM | POA: Diagnosis not present

## 2020-12-20 DIAGNOSIS — I10 Essential (primary) hypertension: Secondary | ICD-10-CM | POA: Diagnosis not present

## 2020-12-20 DIAGNOSIS — R519 Headache, unspecified: Secondary | ICD-10-CM | POA: Diagnosis not present

## 2020-12-20 DIAGNOSIS — Z789 Other specified health status: Secondary | ICD-10-CM | POA: Diagnosis not present

## 2020-12-20 DIAGNOSIS — W19XXXA Unspecified fall, initial encounter: Secondary | ICD-10-CM | POA: Diagnosis not present

## 2020-12-23 ENCOUNTER — Telehealth: Payer: Self-pay | Admitting: Family Medicine

## 2020-12-23 NOTE — Telephone Encounter (Signed)
Pt had to go to hospital in another state over the weekend and needs an appt with PCP for a follow up. Please  call back and advise.

## 2020-12-23 NOTE — Telephone Encounter (Signed)
Appt made - stitch removal - placed on 10/22 in [place], [place] - fell at a gas station and had to go to ER

## 2020-12-25 ENCOUNTER — Ambulatory Visit: Payer: Medicare Other | Admitting: Family Medicine

## 2020-12-29 ENCOUNTER — Ambulatory Visit (INDEPENDENT_AMBULATORY_CARE_PROVIDER_SITE_OTHER): Payer: Medicare Other | Admitting: Family Medicine

## 2020-12-29 ENCOUNTER — Encounter: Payer: Self-pay | Admitting: Family Medicine

## 2020-12-29 ENCOUNTER — Other Ambulatory Visit: Payer: Self-pay

## 2020-12-29 VITALS — BP 122/64 | HR 58 | Temp 97.8°F | Ht 69.0 in | Wt 210.4 lb

## 2020-12-29 DIAGNOSIS — Z4802 Encounter for removal of sutures: Secondary | ICD-10-CM | POA: Diagnosis not present

## 2020-12-29 DIAGNOSIS — S0181XD Laceration without foreign body of other part of head, subsequent encounter: Secondary | ICD-10-CM | POA: Diagnosis not present

## 2020-12-29 NOTE — Patient Instructions (Signed)
Laceration Care, Adult A laceration is a cut that may go through all layers of the skin and into the tissue that is right under the skin. Some lacerations heal on their own. Others need to be closed with stitches (sutures), staples, skin adhesive strips, or skin glue. Proper care of a laceration reduces the risk for infection, helps the laceration heal better, and may prevent scarring. General tips Keep the wound clean and dry. Do not scratch or pick at the wound. Wash your hands with soap and water for at least 20 seconds before and after touching your wound or changing your bandage (dressing). If soap and water are not available, use hand sanitizer. Do not usedisinfectants or antiseptics, such as rubbing alcohol, to clean your wound unless told by your health care provider. If you were given a dressing, you should change it at least once a day, or as told by your health care provider. You should also change it if it becomes wet or dirty. How to care for your laceration If sutures or staples were used: Keep the wound completely dry for the first 24 hours, or as told by your health care provider. After that time, you may shower or bathe. Do not soak your wound in water until after the sutures or staples have been removed. Clean the wound once each day, or as told by your health care provider. To do this: Wash the wound with soap and water. Rinse the wound with water to remove all soap. Pat the wound dry with a clean towel. Do not rub the wound. After cleaning the wound, apply a thin layer of antibiotic ointment, other topical ointments, or a non-adherent dressing as told by your health care provider. This will help prevent infection and keep the dressing from sticking to the wound. Have the sutures or staples removed as told by your health care provider. Do not  remove sutures or staples yourself. If skin adhesive strips were used: Do not get the skin adhesive strips wet. You may shower or bathe,  but keep the wound dry. If the wound gets wet, pat it dry with a clean towel. Do not rub the wound. Skin adhesive strips fall off on their own. If adhesive strip edges start to loosen and curl up, you may trim the loose edges. Do not remove adhesive strips completely unless your health care provider tells you to do that. If skin glue was used: You may shower or bathe, but try to keep the wound dry. Do not soak the wound in water. After showering or bathing, pat the wound dry with a clean towel. Do not rub the wound. Do not do any activities that will make you sweat a lot until the skin glue has fallen off. Do not apply liquid, cream, or ointment medicine to the wound while the skin glue is in place. Doing this may loosen the film before the wound has healed. If a dressing is placed over the wound, do not apply tape directly over the skin glue. Doing this may cause the glue to be pulled off before the wound has healed. Do not pick at the glue. Skin glue usually remains in place for 5-10 days and then falls off the skin. Follow these instructions at home: Medicines Take over-the-counter and prescription medicines only as told by your health care provider. If you were prescribed an antibiotic medicine or ointment, take or apply it as told by your health care provider. Do not stop using it even if   your condition improves. Managing pain and swelling If directed, put ice on the injured area. To do this: Put ice in a plastic bag. Place a towel between your skin and the bag. Leave the ice on for 20 minutes, 2-3 times a day. Remove the ice if your skin turns bright red. This is very important. If you cannot feel pain, heat, or cold, you have a greater risk of damage to the area. Raise (elevate) the injured area above the level of your heart while you are sitting or lying down for the first 24-48 hours after the laceration is repaired. General instructions  Avoid any activity that could cause your wound  to reopen. Check your wound every day for signs of infection. Watch for: More redness, swelling, or pain. Fluid or blood. Warmth. Pus or a bad smell. Keep all follow-up visits. This is important. Contact a health care provider if: You received a tetanus shot and you have swelling, severe pain, redness, or bleeding at the injection site. Your closed wound breaks open. You have any of these signs of infection: More redness, swelling, or pain around your wound. Fluid or blood coming from your wound. Warmth coming from your wound. Pus or a bad smell coming from your wound. A fever. You notice something coming out of the wound, such as wood or glass. Your pain is not controlled with medicine. You notice a change in the color of your skin near your wound. You need to change the dressing often. You develop a new rash. You have numbness around the wound. Get help right away if: You develop severe swelling around the wound. Your pain suddenly increases and is severe. You develop painful lumps near the wound or on skin anywhere else on your body. You have a red streak going away from your wound. The wound is on your hand or foot, and you cannot properly move a finger or toe. The wound is on your hand or foot, and you notice that your fingers or toes look pale or bluish. Summary A laceration is a cut that may go through all layers of the skin and into the tissue that is right under the skin. Some lacerations heal on their own. Others need to be closed with stitches (sutures), staples, skin adhesive strips, or skin glue. Proper care of a laceration reduces the risk of infection, helps the laceration heal better, and may prevent scarring. This information is not intended to replace advice given to you by your health care provider. Make sure you discuss any questions you have with your health care provider. Document Revised: 04/24/2020 Document Reviewed: 04/24/2020 Elsevier Patient Education   [place].

## 2020-12-29 NOTE — Progress Notes (Signed)
Acute Office Visit  Subjective:    Patient ID: Nathan Ayala., male    DOB: 02/16/1946, 75 y.o.   MRN: [id number]  Chief Complaint  Patient presents with   Suture / Staple Removal    HPI Patient is in today for suture removal. He fell on 12/20/20 while on vacation in [place]. He hit is head on asphalt which resulted in a bleeding laceration to his left forehead. He was seen in the ED there. CT scan was negative per patient report. He had 4 sutures placed to the laceration. He was instructed to follow up with his PCP in 7 days to have them removed. Today was the earliest he could get an appointment. He denies fever, pain, drainage, or warmth. He has been checking the laceration bandaged. He was given a course of antibiotics. He also had a tetanus shot in the ED.   Past Medical History:  Diagnosis Date   Atypical chest pain    Chronic Secondary to musculoskeletal disease of cervical spine followed by Dr. Jannifer Franklin   Benign essential HTN 07/30/2014   Chronic chest wall pain 11/25/2016   Coronary artery disease 08/2003   s/p inferior lateral myocardial infarction with PCI of Proximal RCA. He is also s/p PTCA stenting of Proximal LAD September 2006   Edema extremities 08/01/2017   Hyperlipidemia    Stable angina (HCC)     Past Surgical History:  Procedure Laterality Date   arthroscopic knee surgery     CARDIAC CATHETERIZATION     CORONARY ANGIOPLASTY     heart attack     KIDNEY STONE SURGERY     REPAIR OF PERFORATED ULCER     VASECTOMY      Family History  Problem Relation Age of Onset   Kidney cancer Mother    Hypertension Mother    Diabetes Mother    Cancer Mother    Cancer Father     Social History   Socioeconomic History   Marital status: Married    Spouse name: Judson Roch   Number of children: 3   Years of education: Not on file   Highest education level: Master's degree (e.g., MA, MS, MEng, MEd, MSW, MBA)  Occupational History   Occupation: pastor  Tobacco Use   Smoking  status: Former    Packs/day: 1.50    Years: 10.00    Pack years: 15.00    Types: Cigarettes    Quit date: 03/01/1970    Years since quitting: 50.8   Smokeless tobacco: Never  Vaping Use   Vaping Use: Never used  Substance and Sexual Activity   Alcohol use: No    Alcohol/week: 0.0 standard drinks   Drug use: No   Sexual activity: Not Currently  Other Topics Concern   Not on file  Social History Narrative   53 years, 3 children, 3 grandchildren   He is a Environmental education officer. 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   Social Determinants of Health   Financial Resource Strain: Low Risk    Difficulty of Paying Living Expenses: Not hard at all  Food Insecurity: No Food Insecurity   Worried About Charity fundraiser in the Last Year: Never true   Arboriculturist in the Last Year: Never true  Transportation Needs: No Transportation Needs   Lack of Transportation (Medical): No   Lack of Transportation (Non-Medical): No  Physical Activity: Insufficiently Active  Days of Exercise per Week: 7 days   Minutes of Exercise per Session: 20 min  Stress: No Stress Concern Present   Feeling of Stress : Not at all  Social Connections: Socially Integrated   Frequency of Communication with Friends and Family: More than three times a week   Frequency of Social Gatherings with Friends and Family: More than three times a week   Attends Religious Services: More than 4 times per year   Active Member of [employer] or Organizations: Yes   Attends Music therapist: More than 4 times per year   Marital Status: Married  Human resources officer Violence: Not At Risk   Fear of Current or Ex-Partner: No   Emotionally Abused: No   Physically Abused: No   Sexually Abused: No    Outpatient Medications Prior to Visit  Medication Sig Dispense Refill   Ascorbic Acid (VITAMIN C) 1000 MG tablet Take 1,000 mg by mouth daily.     aspirin 81 MG tablet Take 81 mg by mouth  daily.     Cholecalciferol (D3-1000) 25 MCG (1000 UT) capsule Take 1,000 Units by mouth daily.     clopidogrel (PLAVIX) 75 MG tablet Take 1 tablet (75 mg total) by mouth daily. 90 tablet 3   Coenzyme Q10 (COQ-10) 200 MG CAPS Take 1 capsule by mouth daily.     esomeprazole (NEXIUM) 40 MG capsule Take 1 capsule (40 mg total) by mouth daily. 90 capsule 3   gabapentin (NEURONTIN) 400 MG capsule Take 1 capsule (400 mg total) by mouth 3 (three) times daily. 180 capsule 3   hydrochlorothiazide (HYDRODIURIL) 25 MG tablet TAKE ONE (1) TABLET EACH DAY 90 tablet 3   isosorbide mononitrate (IMDUR) 30 MG 24 hr tablet Take 1 tablet (30 mg total) by mouth 2 (two) times daily. 180 tablet 3   nitroGLYCERIN (NITROSTAT) 0.4 MG SL tablet DISSOLVE 1 TABLET UNDER TONGUE FOR CHESTPAIN. MAY REPEAT EVERY 5 MINUTES FOR 3 DOSES. IF NO RELIEF CALL 911 OR GO TO ER 25 tablet 9   Omega 3 1200 MG CAPS Take 1,200-2,400 mg by mouth 3 (three) times daily. Takes 2 caps in am, 2 caps at lunch, 1 cap in evening     rosuvastatin (CRESTOR) 10 MG tablet Take 1 tablet (10 mg total) by mouth daily. 90 tablet 3   vitamin B-12 (CYANOCOBALAMIN) 1000 MCG tablet Take 1,000 mcg by mouth daily.     zinc gluconate 50 MG tablet Take 50 mg by mouth daily.     No facility-administered medications prior to visit.    No Known Allergies  Review of Systems As per HPI.    Objective:    Physical Exam Vitals and nursing note reviewed.  Constitutional:      General: He is not in acute distress.    Appearance: He is not ill-appearing, toxic-appearing or diaphoretic.  Pulmonary:     Effort: Pulmonary effort is normal. No respiratory distress.  Skin:    General: Skin is warm and dry.     Findings: Laceration (2 in laceration to left lateral forehead. Edges well approximated. Scabbing present. No erythema, warmth, or drainage.) present.  Neurological:     Mental Status: He is alert and oriented to person, place, and time.  Psychiatric:         Mood and Affect: Mood normal.        Behavior: Behavior normal.   Suture Removal  Date/Time: 12/29/2020 2:41 PM Performed by: Gwenlyn Perking, FNP Authorized by: Lilia Pro,  Arman Bogus, FNP  Body area: head/neck Location details: forehead Wound Appearance: clean Sutures Removed: 4 Post-removal: dressing applied Patient tolerance: patient tolerated the procedure well with no immediate complications     BP 909/31   Pulse (!) 58   Temp 97.8 F (36.6 C) (Temporal)   Ht 5' 9"  (1.753 m)   Wt 210 lb 6 oz (95.4 kg)   BMI 31.07 kg/m  Wt Readings from Last 3 Encounters:  12/29/20 210 lb 6 oz (95.4 kg)  12/09/20 200 lb (90.7 kg)  11/07/20 206 lb 12.8 oz (93.8 kg)    Health Maintenance Due  Topic Date Due   COVID-19 Vaccine (1) Never done   Hepatitis C Screening  Never done   TETANUS/TDAP  Never done   Zoster Vaccines- Shingrix (1 of 2) Never done    There are no preventive care reminders to display for this patient.   No results found for: TSH Lab Results  Component Value Date   WBC 8.8 06/24/2020   HGB 17.1 06/24/2020   HCT 50.8 06/24/2020   MCV 87 06/24/2020   PLT 198 06/24/2020   Lab Results  Component Value Date   NA 140 11/07/2020   K 4.0 11/07/2020   CO2 25 11/07/2020   GLUCOSE 99 11/07/2020   BUN 17 11/07/2020   CREATININE 1.29 (H) 11/07/2020   BILITOT 3.0 (H) 06/24/2020   ALKPHOS 81 06/24/2020   AST 21 06/24/2020   ALT 31 06/24/2020   PROT 6.8 06/24/2020   ALBUMIN 4.7 06/24/2020   CALCIUM 9.9 11/07/2020   ANIONGAP 9 09/28/2015   EGFR 57 (L) 11/07/2020   GFR 50.29 (L) 02/04/2014   Lab Results  Component Value Date   CHOL 112 06/24/2020   Lab Results  Component Value Date   HDL 35 (L) 06/24/2020   Lab Results  Component Value Date   LDLCALC 55 06/24/2020   Lab Results  Component Value Date   TRIG 124 06/24/2020   Lab Results  Component Value Date   CHOLHDL 3.2 06/24/2020   Lab Results  Component Value Date   HGBA1C 5.6 12/20/2013        Assessment & Plan:   Graeson was seen today for suture / staple removal.  Diagnoses and all orders for this visit:  Laceration of forehead, subsequent encounter Encounter for removal of sutures Edges well approximated, no signs of infection. Sutures removed. Discussed laceration care, handout given.  -     Suture Removal  Return to office for new or worsening symptoms, or if symptoms persist.   The patient indicates understanding of these issues and agrees with the plan.  Gwenlyn Perking, FNP

## 2021-01-08 ENCOUNTER — Encounter: Payer: Self-pay | Admitting: Family Medicine

## 2021-01-08 ENCOUNTER — Ambulatory Visit (INDEPENDENT_AMBULATORY_CARE_PROVIDER_SITE_OTHER): Payer: Medicare Other | Admitting: Family Medicine

## 2021-01-08 ENCOUNTER — Other Ambulatory Visit: Payer: Self-pay

## 2021-01-08 VITALS — BP 110/64 | HR 66 | Ht 69.0 in | Wt 207.0 lb

## 2021-01-08 DIAGNOSIS — I10 Essential (primary) hypertension: Secondary | ICD-10-CM | POA: Diagnosis not present

## 2021-01-08 DIAGNOSIS — I25119 Atherosclerotic heart disease of native coronary artery with unspecified angina pectoris: Secondary | ICD-10-CM | POA: Diagnosis not present

## 2021-01-08 DIAGNOSIS — K219 Gastro-esophageal reflux disease without esophagitis: Secondary | ICD-10-CM

## 2021-01-08 DIAGNOSIS — Z23 Encounter for immunization: Secondary | ICD-10-CM

## 2021-01-08 DIAGNOSIS — E7800 Pure hypercholesterolemia, unspecified: Secondary | ICD-10-CM

## 2021-01-08 NOTE — Progress Notes (Signed)
BP 110/64   Pulse 66   Ht 5' 9"  (1.753 m)   Wt 207 lb (93.9 kg)   SpO2 96%   BMI 30.57 kg/m    Subjective:   Patient ID: Nathan Bleacher., male    DOB: Nov 07, 1945, 75 y.o.   MRN: [id number]  HPI: Nathan Hasz. is a 75 y.o. male presenting on 01/08/2021 for Medical Management of Chronic Issues and Hyperlipidemia   HPI Hyperlipidemia and CAD Patient is coming in for recheck of his hyperlipidemia. The patient is currently taking fish oil and Crestor and Plavix. They deny any issues with myalgias or history of liver damage from it. They deny any focal numbness or weakness or chest pain.  Sees Dr. Radford Pax for this  Hypertension Patient is currently on Imdur and hydrochlorothiazide, and their blood pressure today is 110/64. Patient denies any lightheadedness or dizziness. Patient denies headaches, blurred vision, chest pains, shortness of breath, or weakness. Denies any side effects from medication and is content with current medication.   GERD Patient is currently on Nexium.  She denies any major symptoms or abdominal pain or belching or burping. She denies any blood in her stool or lightheadedness or dizziness.   Patient takes gabapentin for neuropathy secondary to history of shingles, he feels like it is doing very well for him.  He is actually started to back off on it and only uses it once or twice a day and that seems to be sufficient.  Recommended to continue forward at that dosing.  Relevant past medical, surgical, family and social history reviewed and updated as indicated. Interim medical history since our last visit reviewed. Allergies and medications reviewed and updated.  Review of Systems  Constitutional:  Negative for chills and fever.  Eyes:  Negative for visual disturbance.  Respiratory:  Negative for shortness of breath and wheezing.   Cardiovascular:  Negative for chest pain and leg swelling.  Musculoskeletal:  Negative for back pain and gait problem.  Skin:   Negative for rash.  Neurological:  Negative for dizziness.  All other systems reviewed and are negative.  Per HPI unless specifically indicated above   Allergies as of 01/08/2021   No Known Allergies      Medication List        Accurate as of January 08, 2021 12:14 PM. If you have any questions, ask your nurse or doctor.          aspirin 81 MG tablet Take 81 mg by mouth daily.   clopidogrel 75 MG tablet Commonly known as: PLAVIX Take 1 tablet (75 mg total) by mouth daily.   CoQ-10 200 MG Caps Take 1 capsule by mouth daily.   D3-1000 25 MCG (1000 UT) capsule Generic drug: Cholecalciferol Take 1,000 Units by mouth daily.   esomeprazole 40 MG capsule Commonly known as: NEXIUM Take 1 capsule (40 mg total) by mouth daily.   gabapentin 400 MG capsule Commonly known as: NEURONTIN Take 1 capsule (400 mg total) by mouth 3 (three) times daily.   hydrochlorothiazide 25 MG tablet Commonly known as: HYDRODIURIL TAKE ONE (1) TABLET EACH DAY   isosorbide mononitrate 30 MG 24 hr tablet Commonly known as: IMDUR Take 1 tablet (30 mg total) by mouth 2 (two) times daily.   nitroGLYCERIN 0.4 MG SL tablet Commonly known as: NITROSTAT DISSOLVE 1 TABLET UNDER TONGUE FOR CHESTPAIN. MAY REPEAT EVERY 5 MINUTES FOR 3 DOSES. IF NO RELIEF CALL 911 OR GO TO ER   Omega  3 1200 MG Caps Take 1,200-2,400 mg by mouth 3 (three) times daily. Takes 2 caps in am, 2 caps at lunch, 1 cap in evening   rosuvastatin 10 MG tablet Commonly known as: CRESTOR Take 1 tablet (10 mg total) by mouth daily.   vitamin B-12 1000 MCG tablet Commonly known as: CYANOCOBALAMIN Take 1,000 mcg by mouth daily.   vitamin C 1000 MG tablet Take 1,000 mg by mouth daily.   zinc gluconate 50 MG tablet Take 50 mg by mouth daily.         Objective:   BP 110/64   Pulse 66   Ht 5' 9"  (1.753 m)   Wt 207 lb (93.9 kg)   SpO2 96%   BMI 30.57 kg/m   Wt Readings from Last 3 Encounters:  01/08/21 207 lb  (93.9 kg)  12/29/20 210 lb 6 oz (95.4 kg)  12/09/20 200 lb (90.7 kg)    Physical Exam Vitals and nursing note reviewed.  Constitutional:      General: He is not in acute distress.    Appearance: He is well-developed. He is not diaphoretic.  Eyes:     General: No scleral icterus.       Right eye: No discharge.     Conjunctiva/sclera: Conjunctivae normal.     Pupils: Pupils are equal, round, and reactive to light.  Neck:     Thyroid: No thyromegaly.  Cardiovascular:     Rate and Rhythm: Normal rate and regular rhythm.     Heart sounds: Normal heart sounds. No murmur heard. Pulmonary:     Effort: Pulmonary effort is normal. No respiratory distress.     Breath sounds: Normal breath sounds. No wheezing.  Musculoskeletal:        General: Normal range of motion.     Cervical back: Neck supple.  Lymphadenopathy:     Cervical: No cervical adenopathy.  Skin:    General: Skin is warm and dry.     Findings: No rash.  Neurological:     Mental Status: He is alert and oriented to person, place, and time.     Coordination: Coordination normal.  Psychiatric:        Behavior: Behavior normal.      Assessment & Plan:   Problem List Items Addressed This Visit       Cardiovascular and Mediastinum   Coronary artery disease involving native coronary artery of native heart with angina pectoris ([place])   Benign essential HTN   Relevant Orders   CMP14+EGFR     Digestive   GERD (gastroesophageal reflux disease)   Relevant Orders   CBC with Differential/Platelet     Other   Pure hypercholesterolemia - Primary   Relevant Orders   Lipid panel   CMP14+EGFR    Seems to be doing well, continue current medicines. Follow up plan: Return in about 1 year (around 01/08/2022), or if symptoms worsen or fail to improve, for Hypertension and GERD and cholesterol.  Counseling provided for all of the vaccine components Orders Placed This Encounter  Procedures   CBC with Differential/Platelet    Lipid panel   [place] Darneshia Demary, MD [facility] 01/08/2021, 12:14 PM

## 2021-01-09 LAB — CBC WITH DIFFERENTIAL/PLATELET
Basophils Absolute: 0 10*3/uL (ref 0.0–0.2)
Basos: 1 %
EOS (ABSOLUTE): 0.1 10*3/uL (ref 0.0–0.4)
Eos: 1 %
Hematocrit: 54.6 % — ABNORMAL HIGH (ref 37.5–51.0)
Hemoglobin: 17.9 g/dL — ABNORMAL HIGH (ref 13.0–17.7)
Immature Grans (Abs): 0 10*3/uL (ref 0.0–0.1)
Immature Granulocytes: 0 %
Lymphocytes Absolute: 3.3 10*3/uL — ABNORMAL HIGH (ref 0.7–3.1)
Lymphs: 38 %
MCH: 28.5 pg (ref 26.6–33.0)
MCHC: 32.8 g/dL (ref 31.5–35.7)
MCV: 87 fL (ref 79–97)
Monocytes Absolute: 0.8 10*3/uL (ref 0.1–0.9)
Monocytes: 9 %
Neutrophils Absolute: 4.4 10*3/uL (ref 1.4–7.0)
Neutrophils: 51 %
Platelets: 222 10*3/uL (ref 150–450)
RBC: 6.27 x10E6/uL — ABNORMAL HIGH (ref 4.14–5.80)
RDW: 14 % (ref 11.6–15.4)
WBC: 8.6 10*3/uL (ref 3.4–10.8)

## 2021-01-09 LAB — CMP14+EGFR
ALT: 33 IU/L (ref 0–44)
AST: 26 IU/L (ref 0–40)
Albumin/Globulin Ratio: 2.4 — ABNORMAL HIGH (ref 1.2–2.2)
Albumin: 4.7 g/dL (ref 3.7–4.7)
Alkaline Phosphatase: 88 IU/L (ref 44–121)
BUN/Creatinine Ratio: 10 (ref 10–24)
BUN: 14 mg/dL (ref 8–27)
Bilirubin Total: 2.2 mg/dL — ABNORMAL HIGH (ref 0.0–1.2)
CO2: 27 mmol/L (ref 20–29)
Calcium: 9.8 mg/dL (ref 8.6–10.2)
Chloride: 101 mmol/L (ref 96–106)
Creatinine, Ser: 1.43 mg/dL — ABNORMAL HIGH (ref 0.76–1.27)
Globulin, Total: 2 g/dL (ref 1.5–4.5)
Glucose: 110 mg/dL — ABNORMAL HIGH (ref 70–99)
Potassium: 4 mmol/L (ref 3.5–5.2)
Sodium: 142 mmol/L (ref 134–144)
Total Protein: 6.7 g/dL (ref 6.0–8.5)
eGFR: 50 mL/min/{1.73_m2} — ABNORMAL LOW (ref >59)

## 2021-01-09 LAB — LIPID PANEL
Chol/HDL Ratio: 3.1 ratio (ref 0.0–5.0)
Cholesterol, Total: 110 mg/dL (ref 100–199)
HDL: 36 mg/dL — ABNORMAL LOW (ref >39)
LDL Chol Calc (NIH): 55 mg/dL (ref 0–99)
Triglycerides: 102 mg/dL (ref 0–149)
VLDL Cholesterol Cal: 19 mg/dL (ref 5–40)

## 2021-02-06 DIAGNOSIS — H5211 Myopia, right eye: Secondary | ICD-10-CM | POA: Diagnosis not present

## 2021-02-06 DIAGNOSIS — H25813 Combined forms of age-related cataract, bilateral: Secondary | ICD-10-CM | POA: Diagnosis not present

## 2021-02-06 DIAGNOSIS — H02831 Dermatochalasis of right upper eyelid: Secondary | ICD-10-CM | POA: Diagnosis not present

## 2021-02-06 DIAGNOSIS — H3500 Unspecified background retinopathy: Secondary | ICD-10-CM | POA: Diagnosis not present

## 2021-02-26 DIAGNOSIS — H2513 Age-related nuclear cataract, bilateral: Secondary | ICD-10-CM | POA: Diagnosis not present

## 2021-02-26 DIAGNOSIS — D3131 Benign neoplasm of right choroid: Secondary | ICD-10-CM | POA: Diagnosis not present

## 2021-03-19 DIAGNOSIS — H2511 Age-related nuclear cataract, right eye: Secondary | ICD-10-CM | POA: Diagnosis not present

## 2021-03-19 DIAGNOSIS — H25811 Combined forms of age-related cataract, right eye: Secondary | ICD-10-CM | POA: Diagnosis not present

## 2021-06-08 ENCOUNTER — Other Ambulatory Visit: Payer: Self-pay

## 2021-06-08 ENCOUNTER — Emergency Department (HOSPITAL_COMMUNITY): Payer: Medicare Other

## 2021-06-08 ENCOUNTER — Encounter (HOSPITAL_COMMUNITY): Payer: Self-pay | Admitting: Emergency Medicine

## 2021-06-08 ENCOUNTER — Emergency Department (HOSPITAL_COMMUNITY)
Admission: EM | Admit: 2021-06-08 | Discharge: 2021-06-08 | Disposition: A | Discharge status: Home / Self Care | Payer: Medicare Other | Attending: Emergency Medicine | Admitting: Emergency Medicine

## 2021-06-08 DIAGNOSIS — M545 Low back pain, unspecified: Secondary | ICD-10-CM | POA: Diagnosis not present

## 2021-06-08 DIAGNOSIS — Z7982 Long term (current) use of aspirin: Secondary | ICD-10-CM | POA: Diagnosis not present

## 2021-06-08 DIAGNOSIS — M2578 Osteophyte, vertebrae: Secondary | ICD-10-CM | POA: Diagnosis not present

## 2021-06-08 DIAGNOSIS — M25552 Pain in left hip: Secondary | ICD-10-CM | POA: Diagnosis not present

## 2021-06-08 DIAGNOSIS — Z7902 Long term (current) use of antithrombotics/antiplatelets: Secondary | ICD-10-CM | POA: Diagnosis not present

## 2021-06-08 DIAGNOSIS — M5442 Lumbago with sciatica, left side: Secondary | ICD-10-CM | POA: Insufficient documentation

## 2021-06-08 DIAGNOSIS — M5432 Sciatica, left side: Secondary | ICD-10-CM

## 2021-06-08 MED ORDER — TRAMADOL HCL 50 MG PO TABS: 50.0000 mg | ORAL_TABLET | Freq: Four times a day (QID) | ORAL | 0 refills | Status: DC | PRN

## 2021-06-08 MED ORDER — IBUPROFEN 800 MG PO TABS
800.0000 mg | ORAL_TABLET | Freq: Once | ORAL | Status: DC
  Filled 2021-06-08: qty 1

## 2021-06-08 MED ORDER — ACETAMINOPHEN 325 MG PO TABS
650.0000 mg | ORAL_TABLET | Freq: Once | ORAL | Status: AC
Start: 2021-06-08 — End: 2021-06-08
  Administered 2021-06-08: 650 mg via ORAL
  Filled 2021-06-08: qty 2

## 2021-06-08 NOTE — ED Notes (Signed)
Pt verbalized understanding of d/c instructions, meds and followup care. Denies questions. VSS, no distress noted. Steady gait to exit with all belongings.  ?

## 2021-06-08 NOTE — ED Triage Notes (Signed)
Patient coming from home, complaint of left hip pain that began approx 3 days ago, states just woke up with the pain. Endorses the pain travels all the way to his ankle. ?

## 2021-06-08 NOTE — ED Provider Notes (Signed)
?[facility] ?Provider Note ? ? ?CSN: [id number] ?Arrival date & time: 06/08/21  1015 ? ?  ? ?History ? ?Chief Complaint  ?Patient presents with  ? Hip Pain  ?  Left  ? ? ?Nathan Baswell. is a 76 y.o. male. ? ?HPI ?76 year old man history of sciatica presents today complaining of left low back, left hip, and left leg pain.  He states the pain began approximately 3 days ago.  Pain shoots down into his leg.  He has no history of recent trauma.  He is on Plavix but no other blood thinners.  He denies any weakness, numbness, tingling, loss of bowel or bladder control. ? ?  ? ?Home Medications ?Prior to Admission medications   ?Medication Sig Start Date End Date Taking? Authorizing Provider  ?traMADol (ULTRAM) 50 MG tablet Take 1 tablet (50 mg total) by mouth every 6 (six) hours as needed. 06/08/21  Yes Pattricia Boss, MD  ?Ascorbic Acid (VITAMIN C) 1000 MG tablet Take 1,000 mg by mouth daily.    [provider]  ?aspirin 81 MG tablet Take 81 mg by mouth daily.    [provider]  ?Cholecalciferol (D3-1000) 25 MCG (1000 UT) capsule Take 1,000 Units by mouth daily.    [provider]  ?clopidogrel (PLAVIX) 75 MG tablet Take 1 tablet (75 mg total) by mouth daily. 11/07/20   Sueanne Margarita, MD  ?Coenzyme Q10 (COQ-10) 200 MG CAPS Take 1 capsule by mouth daily.    [provider]  ?esomeprazole (NEXIUM) 40 MG capsule Take 1 capsule (40 mg total) by mouth daily. 06/24/20   Dettinger, Fransisca Kaufmann, MD  ?gabapentin (NEURONTIN) 400 MG capsule Take 1 capsule (400 mg total) by mouth 3 (three) times daily. 07/01/20   Dettinger, Fransisca Kaufmann, MD  ?hydrochlorothiazide (HYDRODIURIL) 25 MG tablet TAKE ONE (1) TABLET EACH DAY 11/07/20   Sueanne Margarita, MD  ?isosorbide mononitrate (IMDUR) 30 MG 24 hr tablet Take 1 tablet (30 mg total) by mouth 2 (two) times daily. 11/07/20   Sueanne Margarita, MD  ?nitroGLYCERIN (NITROSTAT) 0.4 MG SL tablet DISSOLVE 1 TABLET UNDER TONGUE FOR  CHESTPAIN. MAY REPEAT EVERY 5 MINUTES FOR 3 DOSES. IF NO RELIEF CALL 911 OR GO TO ER 09/18/20   Sueanne Margarita, MD  ?Omega 3 1200 MG CAPS Take 1,200-2,400 mg by mouth 3 (three) times daily. Takes 2 caps in am, 2 caps at lunch, 1 cap in evening    [provider]  ?rosuvastatin (CRESTOR) 10 MG tablet Take 1 tablet (10 mg total) by mouth daily. 11/07/20   Sueanne Margarita, MD  ?vitamin B-12 (CYANOCOBALAMIN) 1000 MCG tablet Take 1,000 mcg by mouth daily.    [provider]  ?zinc gluconate 50 MG tablet Take 50 mg by mouth daily.    [provider]  ?   ? ?Allergies    ?Patient has no known allergies.   ? ?Review of Systems   ?Review of Systems  ?All other systems reviewed and are negative. ? ?Physical Exam ?Updated Vital Signs ?BP (!) 142/96   Pulse 71   Temp 97.6 ?F (36.4 ?C) (Oral)   Resp 20   SpO2 94%  ?Physical Exam ?Vitals and nursing note reviewed.  ?Constitutional:   ?   Appearance: Normal appearance.  ?HENT:  ?   Head: Normocephalic.  ?   Right Ear: External ear normal.  ?   Left Ear: External ear normal.  ?  Nose: Nose normal.  ?   Mouth/Throat:  ?   Pharynx: Oropharynx is clear.  ?Eyes:  ?   Extraocular Movements: Extraocular movements intact.  ?   Pupils: Pupils are equal, round, and reactive to light.  ?Cardiovascular:  ?   Rate and Rhythm: Normal rate and regular rhythm.  ?Pulmonary:  ?   Effort: Pulmonary effort is normal.  ?Abdominal:  ?   General: Abdomen is flat.  ?   Palpations: Abdomen is soft.  ?Musculoskeletal:     ?   General: Normal range of motion.  ?   Cervical back: Normal range of motion and neck supple.  ?Skin: ?   General: Skin is warm.  ?   Capillary Refill: Capillary refill takes less than 2 seconds.  ?Neurological:  ?   General: No focal deficit present.  ?   Mental Status: He is alert.  ?   Cranial Nerves: No cranial nerve deficit.  ?   Sensory: No sensory deficit.  ?   Motor: No weakness.  ?   Coordination: Coordination normal.  ?   Gait: Gait normal.  ?    Deep Tendon Reflexes: Reflexes normal.  ?Psychiatric:     ?   Mood and Affect: Mood normal.  ? ? ?ED Results / Procedures / Treatments   ?Labs ?(all labs ordered are listed, but only abnormal results are displayed) ?Labs Reviewed - No data to display ? ?EKG ?None ? ?Radiology ?DG Lumbar Spine Complete ? ?Result Date: 06/08/2021 ?CLINICAL DATA:  Acute left hip pain.  No reported injury. EXAM: LUMBAR SPINE - COMPLETE 4+ VIEW COMPARISON:  None. FINDINGS: There is no evidence of lumbar spine fracture. Alignment is normal. Intervertebral disc spaces are maintained. Mild anterior osteophyte formation is noted at L2-3 and L3-4. IMPRESSION: Mild multilevel degenerative changes are noted. No acute abnormality seen. Aortic Atherosclerosis (ICD10-I70.0). Electronically Signed   By: Marijo Conception M.D.   On: 06/08/2021 12:35  ? ?DG Hip Unilat W or Wo Pelvis 2-3 Views Left ? ?Result Date: 06/08/2021 ?CLINICAL DATA:  Acute left hip pain without known injury. EXAM: DG HIP (WITH OR WITHOUT PELVIS) 2-3V LEFT COMPARISON:  None. FINDINGS: There is no evidence of hip fracture or dislocation. There is no evidence of arthropathy or other focal bone abnormality. IMPRESSION: Negative. Electronically Signed   By: Marijo Conception M.D.   On: 06/08/2021 12:36   ? ?Procedures ?Procedures  ? ? ?Medications Ordered in ED ?Medications  ?ibuprofen (ADVIL) tablet 800 mg (800 mg Oral Patient Refused/Not Given 06/08/21 1324)  ?acetaminophen (TYLENOL) tablet 650 mg (650 mg Oral Given 06/08/21 1315)  ? ? ?ED Course/ Medical Decision Making/ A&P ?  ?                        ?Medical Decision Making ?Amount and/or Complexity of Data Reviewed ?Radiology: ordered. ? ?Risk ?OTC drugs. ?Prescription drug management. ? ? ? ? ? ? ? ? ? ? ?Final Clinical Impression(s) / ED Diagnoses ?Final diagnoses:  ?Sciatica, left side  ? ? ?Rx / DC Orders ?ED Discharge Orders   ? ?      Ordered  ?  traMADol (ULTRAM) 50 MG tablet  Every 6 hours PRN       ? 06/08/21 1527  ? ?  ?   ? ?  ? ? ?  ?Pattricia Boss, MD ?06/08/21 1530 ? ?

## 2021-06-08 NOTE — ED Provider Triage Note (Signed)
Emergency Medicine Provider Triage Evaluation Note ? ?Nathan Ayala , a 76 y.o. male  was evaluated in triage.  Pt complains of multiple days of low back pain.  She states that last night he used a heating pad which helped his symptoms and helped him sleep however now it is back.  Says that it is in his left hip and radiates all the way down his leg. ? ?Reports a history of sciatica ? ?Review of Systems  ?Positive: Back pain radiating down left leg ?Negative: Saddle anesthesia, bowel/bladder dysfunction, IV drug, fevers or chills ? ?Physical Exam  ?BP (!) 142/96   Pulse 71   Temp 97.6 ?F (36.4 ?C) (Oral)   Resp 20   SpO2 94%  ?Gen:   Awake, no distress   ?Resp:  Normal effort  ?MSK:   Moves extremities without difficulty  ?Other:  Nontender lower back.  Nontender left hip.  Says that he is having excruciating pain all the way down his leg but I am unable to elicit ? ?Medical Decision Making  ?Medically screening exam initiated at 10:38 AM.  Appropriate orders placed.  Nathan Ayala. was informed that the remainder of the evaluation will be completed by another provider, this initial triage assessment does not replace that evaluation, and the importance of remaining in the ED until their evaluation is complete. ? ? ?  ?Rhae Hammock, PA-C ?06/08/21 1040 ? ?

## 2021-06-08 NOTE — Discharge Instructions (Addendum)
Please get tramadol filled at [facility] and take as needed along with your acetaminophen. ?Please use walker as needed for ambulation ?Call your doctor for recheck in the next 1 to 3 days ?If continued weakness and numbness, follow-up with neurosurgery ?Return to the emergency department if your leg becomes weak, you lose loss of control of bowel or bladder or other worsening symptoms ? ?

## 2021-06-11 ENCOUNTER — Telehealth: Payer: Self-pay | Admitting: Family Medicine

## 2021-06-11 NOTE — Telephone Encounter (Signed)
Appointment scheduled for 04/14 @ 9:20 with Dr. Livia Snellen.   ?

## 2021-06-12 ENCOUNTER — Ambulatory Visit (INDEPENDENT_AMBULATORY_CARE_PROVIDER_SITE_OTHER): Payer: Medicare Other | Admitting: Family Medicine

## 2021-06-12 ENCOUNTER — Encounter: Payer: Self-pay | Admitting: Family Medicine

## 2021-06-12 VITALS — BP 127/79 | HR 70 | Temp 97.6°F | Ht 69.0 in | Wt 201.8 lb

## 2021-06-12 DIAGNOSIS — M5416 Radiculopathy, lumbar region: Secondary | ICD-10-CM

## 2021-06-12 DIAGNOSIS — M4716 Other spondylosis with myelopathy, lumbar region: Secondary | ICD-10-CM | POA: Diagnosis not present

## 2021-06-12 DIAGNOSIS — M5386 Other specified dorsopathies, lumbar region: Secondary | ICD-10-CM | POA: Diagnosis not present

## 2021-06-12 MED ORDER — BETAMETHASONE SOD PHOS & ACET 6 (3-3) MG/ML IJ SUSP
6.0000 mg | Freq: Once | INTRAMUSCULAR | Status: AC
  Administered 2021-06-12: 6 mg via INTRAMUSCULAR

## 2021-06-12 MED ORDER — TRAMADOL HCL 50 MG PO TABS: 50.0000 mg | ORAL_TABLET | Freq: Four times a day (QID) | ORAL | 0 refills | Status: AC

## 2021-06-12 MED ORDER — PREDNISONE 10 MG PO TABS: ORAL_TABLET | ORAL | 0 refills | Status: DC

## 2021-06-12 NOTE — Progress Notes (Signed)
? ?Subjective:  ?Patient ID: [name], male    DOB: January 01, 1946  Age: 76 y.o. MRN: [id number] ? ?CC: ER FOLLOW UP (SCIATICA) ?4/7 ? ?HPI ?[name] presents for sciatica Dx at E.D. on 4/10. Onset of sx was 4/7. He reported left lower back pain radiating into the hip and leg. Shooting into the leg. XR done at the ED showed degenerative changes of the lumbar spine. The hip film was reported as normal.  He had NKI.  9/10 At ED. 8/10 currently. Some relief with tramadol. NO hx back problems except sciatica 20 years ago.  ? ? ? ? ?  06/12/2021  ?  9:35 AM 01/08/2021  ? 11:30 AM 12/29/2020  ?  2:15 PM  ?Depression screen PHQ 2/9  ?Decreased Interest 0 0 0  ?Down, Depressed, Hopeless 0 0 0  ?PHQ - 2 Score 0 0 0  ?Altered sleeping  0 0  ?Tired, decreased energy  0 0  ?Change in appetite  0 0  ?Feeling bad or failure about yourself   0 0  ?Trouble concentrating  0 0  ?Moving slowly or fidgety/restless  0 0  ?Suicidal thoughts  0 0  ?PHQ-9 Score  0 0  ?Difficult doing work/chores   Not difficult at all  ? ? ?History ?[place] has a past medical history of Atypical chest pain, Benign essential HTN (07/30/2014), Chronic chest wall pain (11/25/2016), Coronary artery disease (08/2003), Edema extremities (08/01/2017), Hyperlipidemia, and Stable angina ([place]).  ? ?He has a past surgical history that includes Cardiac catheterization; Coronary angioplasty; arthroscopic knee surgery; Repair of perforated ulcer; Kidney stone surgery; heart attack; and Vasectomy.  ? ?His family history includes Cancer in his father and mother; Diabetes in his mother; Hypertension in his mother; Kidney cancer in his mother.He reports that he quit smoking about 51 years ago. His smoking use included cigarettes. He has a 15.00 pack-year smoking history. He has never used smokeless tobacco. He reports that he does not drink alcohol and does not use drugs. ? ? ? ?ROS ?Review of Systems  ?Constitutional:  Negative for fever.  ?Respiratory:  Negative for  shortness of breath.   ?Cardiovascular:  Negative for chest pain.  ?Musculoskeletal:  Positive for arthralgias, back pain and myalgias.  ?Skin:  Negative for rash.  ? ?Objective:  ?BP 127/79   Pulse 70   Temp 97.6 ?F (36.4 ?C)   Ht '5\' 9"'$  (1.753 m)   Wt 201 lb 12.8 oz (91.5 kg)   SpO2 94%   BMI 29.80 kg/m?  ? ?BP Readings from Last 3 Encounters:  ?06/12/21 127/79  ?06/08/21 (!) 147/82  ?01/08/21 110/64  ? ? ?Wt Readings from Last 3 Encounters:  ?06/12/21 201 lb 12.8 oz (91.5 kg)  ?01/08/21 207 lb (93.9 kg)  ?12/29/20 210 lb 6 oz (95.4 kg)  ? ? ? ?Physical Exam ?Vitals reviewed.  ?Constitutional:   ?   Appearance: He is well-developed.  ?HENT:  ?   Head: Normocephalic and atraumatic.  ?   Right Ear: External ear normal.  ?   Left Ear: External ear normal.  ?   Mouth/Throat:  ?   Pharynx: No oropharyngeal exudate or posterior oropharyngeal erythema.  ?Eyes:  ?   Pupils: Pupils are equal, round, and reactive to light.  ?Cardiovascular:  ?   Rate and Rhythm: Normal rate and regular rhythm.  ?   Heart sounds: No murmur heard. ?Pulmonary:  ?   Effort: No respiratory distress.  ?  Breath sounds: Normal breath sounds.  ?Musculoskeletal:     ?   General: Tenderness (at sciatic notch and posterior left thigh.  Tender for left straight leg raise.) present. Normal range of motion.  ?   Cervical back: Normal range of motion and neck supple.  ?Neurological:  ?   Mental Status: He is alert and oriented to person, place, and time.  ? ? ? ? ?Assessment & Plan:  ? ?Horst was seen today for er follow up. ? ?Diagnoses and all orders for this visit: ? ?Sciatica associated with disorder of lumbar spine ?-     betamethasone acetate-betamethasone sodium phosphate (CELESTONE) injection 6 mg ? ?Lumbar spondylosis with myelopathy ? ?Lumbar radiculopathy ? ?Other orders ?-     traMADol (ULTRAM) 50 MG tablet; Take 1 tablet (50 mg total) by mouth 4 (four) times daily for 10 days. 1-2 tablets up to 4 times a day as needed for pain ?-      predniSONE (DELTASONE) 10 MG tablet; Take 5 daily for 2 days followed by 4,3,2 and 1 for 2 days each. ? ? ? ? ? ? ?I have discontinued Willette Brace Jr.'s traMADol. I am also having him start on traMADol and predniSONE. Additionally, I am having him maintain his vitamin B-12, aspirin, CoQ-10, Omega 3, vitamin C, zinc gluconate, D3-1000, esomeprazole, gabapentin, nitroGLYCERIN, clopidogrel, hydrochlorothiazide, isosorbide mononitrate, and rosuvastatin. We administered betamethasone acetate-betamethasone sodium phosphate. ? ?Allergies as of 06/12/2021   ?No Known Allergies ?  ? ?  ?Medication List  ?  ? ?  ? Accurate as of June 12, 2021  4:39 PM. If you have any questions, ask your nurse or doctor.  ?  ?  ? ?  ? ?aspirin 81 MG tablet ?Take 81 mg by mouth daily. ?  ?clopidogrel 75 MG tablet ?Commonly known as: PLAVIX ?Take 1 tablet (75 mg total) by mouth daily. ?  ?CoQ-10 200 MG Caps ?Take 1 capsule by mouth daily. ?  ?D3-1000 25 MCG (1000 UT) capsule ?Generic drug: Cholecalciferol ?Take 1,000 Units by mouth daily. ?  ?esomeprazole 40 MG capsule ?Commonly known as: [place] ?Take 1 capsule (40 mg total) by mouth daily. ?  ?gabapentin 400 MG capsule ?Commonly known as: NEURONTIN ?Take 1 capsule (400 mg total) by mouth 3 (three) times daily. ?  ?hydrochlorothiazide 25 MG tablet ?Commonly known as: HYDRODIURIL ?TAKE ONE (1) TABLET EACH DAY ?  ?isosorbide mononitrate 30 MG 24 hr tablet ?Commonly known as: IMDUR ?Take 1 tablet (30 mg total) by mouth 2 (two) times daily. ?  ?nitroGLYCERIN 0.4 MG SL tablet ?Commonly known as: NITROSTAT ?DISSOLVE 1 TABLET UNDER TONGUE FOR CHESTPAIN. MAY REPEAT EVERY 5 MINUTES FOR 3 DOSES. IF NO RELIEF CALL 911 OR GO TO ER ?  ?Omega 3 1200 MG Caps ?Take 1,200-2,400 mg by mouth 3 (three) times daily. Takes 2 caps in am, 2 caps at lunch, 1 cap in evening ?  ?predniSONE 10 MG tablet ?Commonly known as: DELTASONE ?Take 5 daily for 2 days followed by 4,3,2 and 1 for 2 days each. ?Started by: Claretta Fraise, MD ?  ?rosuvastatin 10 MG tablet ?Commonly known as: CRESTOR ?Take 1 tablet (10 mg total) by mouth daily. ?  ?traMADol 50 MG tablet ?Commonly known as: ULTRAM ?Take 1 tablet (50 mg total) by mouth 4 (four) times daily for 10 days. 1-2 tablets up to 4 times a day as needed for pain ?What changed:  ?when to take this ?reasons to take this ?additional instructions ?  Changed by: Claretta Fraise, MD ?  ?vitamin B-12 1000 MCG tablet ?Commonly known as: CYANOCOBALAMIN ?Take 1,000 mcg by mouth daily. ?  ?vitamin C 1000 MG tablet ?Take 1,000 mg by mouth daily. ?  ?zinc gluconate 50 MG tablet ?Take 50 mg by mouth daily. ?  ? ?  ? ? ? ?Follow-up: Return if symptoms worsen or fail to improve. ? ?Claretta Fraise, M.D. ?

## 2021-06-30 DIAGNOSIS — Z6828 Body mass index (BMI) 28.0-28.9, adult: Secondary | ICD-10-CM | POA: Diagnosis not present

## 2021-06-30 DIAGNOSIS — M5416 Radiculopathy, lumbar region: Secondary | ICD-10-CM | POA: Diagnosis not present

## 2021-07-01 DIAGNOSIS — M545 Low back pain, unspecified: Secondary | ICD-10-CM | POA: Diagnosis not present

## 2021-07-01 DIAGNOSIS — M5416 Radiculopathy, lumbar region: Secondary | ICD-10-CM | POA: Diagnosis not present

## 2021-07-09 ENCOUNTER — Other Ambulatory Visit: Payer: Self-pay | Admitting: Cardiology

## 2021-07-09 ENCOUNTER — Telehealth: Payer: Self-pay | Admitting: *Deleted

## 2021-07-09 ENCOUNTER — Other Ambulatory Visit: Payer: Self-pay | Admitting: Student

## 2021-07-09 DIAGNOSIS — Z6828 Body mass index (BMI) 28.0-28.9, adult: Secondary | ICD-10-CM | POA: Diagnosis not present

## 2021-07-09 DIAGNOSIS — M5416 Radiculopathy, lumbar region: Secondary | ICD-10-CM | POA: Diagnosis not present

## 2021-07-09 NOTE — Telephone Encounter (Signed)
? ?  Pre-operative Risk Assessment  ?  ?Patient Name: Nathan Ayala.  ?DOB: October 20, 1945 ?MRN: [id number]  ? ?  ? ?Request for Surgical Clearance   ? ?Procedure:   NERVE ROOT BLOCK INJECTION ? ?Date of Surgery:  Clearance TBD                              ?   ?Surgeon:  MD NOT LISTED ?Surgeon's Group or Practice Name:  [facility] ?Phone number:  [phone] ?Fax number:  [phone] ATTN: CATHY C.  ?  ?Type of Clearance Requested:   ?- Medical  ?- Pharmacy:  Hold Clopidogrel (Plavix) x 5 DAYS PRIOR ?  ?Type of Anesthesia:  Local  ?  ?Additional requests/questions:   ? ?Signed, ?Julaine Hua   ?07/09/2021, 6:10 PM  ? ?

## 2021-07-10 ENCOUNTER — Telehealth: Payer: Self-pay | Admitting: *Deleted

## 2021-07-10 NOTE — Telephone Encounter (Signed)
Tele pre op appt 07/15/21 @ 11 am. Med rec and consent are done.  ? ?  ?Patient Consent for Virtual Visit  ? ? ?   ? ?Nathan Ayala. has provided verbal consent on 07/10/2021 for a virtual visit (video or telephone). ? ? ?CONSENT FOR VIRTUAL VISIT FOR:  Nathan Ayala.  ?By participating in this virtual visit I agree to the following: ? ?I hereby voluntarily request, consent and authorize [facility] and its employed or contracted physicians, physician assistants, nurse practitioners or other licensed health care professionals (the Practitioner), to provide me with telemedicine health care services (the ?Services") as deemed necessary by the treating Practitioner. I acknowledge and consent to receive the Services by the Practitioner via telemedicine. I understand that the telemedicine visit will involve communicating with the Practitioner through live audiovisual communication technology and the disclosure of certain medical information by electronic transmission. I acknowledge that I have been given the opportunity to request an in-person assessment or other available alternative prior to the telemedicine visit and am voluntarily participating in the telemedicine visit. ? ?I understand that I have the right to withhold or withdraw my consent to the use of telemedicine in the course of my care at any time, without affecting my right to future care or treatment, and that the Practitioner or I may terminate the telemedicine visit at any time. I understand that I have the right to inspect all information obtained and/or recorded in the course of the telemedicine visit and may receive copies of available information for a reasonable fee.  I understand that some of the potential risks of receiving the Services via telemedicine include:  ?Delay or interruption in medical evaluation due to technological equipment failure or disruption; ?Information transmitted may not be sufficient (e.g. poor resolution of images) to  allow for appropriate medical decision making by the Practitioner; and/or  ?In rare instances, security protocols could fail, causing a breach of personal health information. ? ?Furthermore, I acknowledge that it is my responsibility to provide information about my medical history, conditions and care that is complete and accurate to the best of my ability. I acknowledge that Practitioner's advice, recommendations, and/or decision may be based on factors not within their control, such as incomplete or inaccurate data provided by me or distortions of diagnostic images or specimens that may result from electronic transmissions. I understand that the practice of medicine is not an exact science and that Practitioner makes no warranties or guarantees regarding treatment outcomes. I acknowledge that a copy of this consent can be made available to me via my patient portal ([facility]), or I can request a printed copy by calling the office of [facility].   ? ?I understand that my insurance will be billed for this visit.  ? ?I have read or had this consent read to me. ?I understand the contents of this consent, which adequately explains the benefits and risks of the Services being provided via telemedicine.  ?I have been provided ample opportunity to ask questions regarding this consent and the Services and have had my questions answered to my satisfaction. ?I give my informed consent for the services to be provided through the use of telemedicine in my medical care ? ? ? ?

## 2021-07-10 NOTE — Telephone Encounter (Signed)
His Plavix may be held prior to his injection. ? ?Preoperative team, please contact this patient and set up a phone call appointment for further cardiac evaluation.  Thank you for your help. ? ?Jossie Ng. Lakin Rhine NP-C ? ?  ?07/10/2021, 10:51 AM ?[place] ?[address] ?Office [phone] Fax [phone] ? ?

## 2021-07-10 NOTE — Telephone Encounter (Signed)
Patient returned your call.

## 2021-07-10 NOTE — Telephone Encounter (Signed)
Left message to call back for tele pre op appt 

## 2021-07-10 NOTE — Telephone Encounter (Signed)
Tele pre op appt 07/15/21 @ 11 am. Med rec and consent are done.  ?

## 2021-07-15 ENCOUNTER — Ambulatory Visit (INDEPENDENT_AMBULATORY_CARE_PROVIDER_SITE_OTHER): Payer: Medicare Other | Admitting: Nurse Practitioner

## 2021-07-15 DIAGNOSIS — Z01810 Encounter for preprocedural cardiovascular examination: Secondary | ICD-10-CM

## 2021-07-15 NOTE — Progress Notes (Signed)
? ?Virtual Visit via Telephone Note  ? ?This visit type was conducted due to national recommendations for restrictions regarding the COVID-19 Pandemic (e.g. social distancing) in an effort to limit this patient's exposure and mitigate transmission in our community.  Due to his co-morbid illnesses, this patient is at least at moderate risk for complications without adequate follow up.  This format is felt to be most appropriate for this patient at this time.  The patient did not have access to video technology/had technical difficulties with video requiring transitioning to audio format only (telephone).  All issues noted in this document were discussed and addressed.  No physical exam could be performed with this format.  Please refer to the patient's chart for his  consent to telehealth for [name]. ? ?Evaluation Performed:  Preoperative cardiovascular risk assessment ?_____________  ? ?Date:  07/15/2021  ? ?Patient ID:  Nathan Debow., DOB 02/22/1946, MRN [id number] ?Patient Location:  ?Home ?Provider location:   ?Office ? ?Primary Care Provider:  Dettinger, Fransisca Kaufmann, MD ?Primary Cardiologist:  Fransico Him, MD ? ?Chief Complaint  ?  ?76 y.o. y/o male with a h/o CAD s/p PCI-pRCA, and PTCA/DES-pLAD in 2006, hypertension, hyperlipidemia, and chronic bilateral lower extremity edema who is pending nerve root block injection, date TBD with [facility], and presents today for telephonic preoperative cardiovascular risk assessment. ? ?Past Medical History  ?  ?Past Medical History:  ?Diagnosis Date  ? Atypical chest pain   ? Chronic Secondary to musculoskeletal disease of cervical spine followed by Dr. Jannifer Franklin  ? Benign essential HTN 07/30/2014  ? Chronic chest wall pain 11/25/2016  ? Coronary artery disease 08/2003  ? s/p inferior lateral myocardial infarction with PCI of Proximal RCA. He is also s/p PTCA stenting of Proximal LAD September 2006  ? Edema extremities 08/01/2017  ? Hyperlipidemia   ? Stable  angina ([facility])   ? ?Past Surgical History:  ?Procedure Laterality Date  ? arthroscopic knee surgery    ? CARDIAC CATHETERIZATION    ? CORONARY ANGIOPLASTY    ? heart attack    ? KIDNEY STONE SURGERY    ? REPAIR OF PERFORATED ULCER    ? VASECTOMY    ? ? ?Allergies ? ?No Known Allergies ? ?History of Present Illness  ?  ?Nathan Floor Jammal Sarr. is a 76 y.o. male who presents via audio/video conferencing for a telehealth visit today.  Pt was last seen in cardiology clinic on 11/07/2020 by Dr. Radford Pax.  At that time Nathan Petrelli. was doing well from a cardiac standpoint. He denied any symptoms concerning for angina.  The patient is now pending procedure as outlined above. Since his last visit, he has done well from a cardiac standpoint. He denies chest pain, palpitations, dyspnea, pnd, orthopnea, n, v, dizziness, syncope, edema, weight gain, or early satiety.  He reports feeling well overall denies any new symptoms or concerns today. ? ?Home Medications  ?  ?Prior to Admission medications   ?Medication Sig Start Date End Date Taking? Authorizing Provider  ?Ascorbic Acid (VITAMIN C) 1000 MG tablet Take 1,000 mg by mouth daily.    [provider]  ?aspirin 81 MG tablet Take 81 mg by mouth daily.    [provider]  ?Cholecalciferol (D3-1000) 25 MCG (1000 UT) capsule Take 1,000 Units by mouth daily.    [provider]  ?clopidogrel (PLAVIX) 75 MG tablet Take 1 tablet (75 mg total) by mouth daily. 11/07/20   Sueanne Margarita,  MD  ?Coenzyme Q10 (COQ-10) 200 MG CAPS Take 1 capsule by mouth daily.    [provider]  ?esomeprazole (NEXIUM) 40 MG capsule Take 1 capsule (40 mg total) by mouth daily. 06/24/20   Dettinger, Fransisca Kaufmann, MD  ?gabapentin (NEURONTIN) 400 MG capsule Take 1 capsule (400 mg total) by mouth 3 (three) times daily. 07/01/20   Dettinger, Fransisca Kaufmann, MD  ?hydrochlorothiazide (HYDRODIURIL) 25 MG tablet TAKE ONE (1) TABLET EACH DAY 11/07/20   Sueanne Margarita, MD  ?isosorbide mononitrate (IMDUR)  30 MG 24 hr tablet Take 1 tablet (30 mg total) by mouth 2 (two) times daily. 11/07/20   Sueanne Margarita, MD  ?nitroGLYCERIN (NITROSTAT) 0.4 MG SL tablet DISSOLVE 1 TABLET UNDER TONGUE FOR CHESTPAIN. MAY REPEAT EVERY 5 MINUTES FOR 3 DOSES. IF NO RELIEF CALL 911 OR GO TO ER 09/18/20   Sueanne Margarita, MD  ?Omega 3 1200 MG CAPS Take 1,200-2,400 mg by mouth 3 (three) times daily. Takes 2 caps in am, 2 caps at lunch, 1 cap in evening    [provider]  ?predniSONE (DELTASONE) 10 MG tablet Take 5 daily for 2 days followed by 4,3,2 and 1 for 2 days each. ?Patient not taking: Reported on 07/10/2021 06/12/21   Claretta Fraise, MD  ?rosuvastatin (CRESTOR) 10 MG tablet Take 1 tablet (10 mg total) by mouth daily. 11/07/20   Sueanne Margarita, MD  ?vitamin B-12 (CYANOCOBALAMIN) 1000 MCG tablet Take 1,000 mcg by mouth daily.    [provider]  ?zinc gluconate 50 MG tablet Take 50 mg by mouth daily.    [provider]  ? ? ?Physical Exam  ?  ?Vital Signs:  Nathan Bleacher. does not have vital signs available for review today. ? ?Given telephonic nature of communication, physical exam is limited. ?AAOx3. NAD. Normal affect.  Speech and respirations are unlabored. ? ?Accessory Clinical Findings  ?  ?None ? ?Assessment & Plan  ?  ?1.  Preoperative Cardiovascular Risk Assessment: ? ?According to the Revised Cardiac Risk Index (RCRI), his Perioperative Risk of Major Cardiac Event is (%): 0.9. His Functional Capacity in METs is: 7.01 according to the Duke Activity Status Index (DASI). Therefore, based on ACC/AHA guidelines, patient would be at acceptable risk for the planned procedure without further cardiovascular testing. ? ?His Plavix may be held for 5 days prior to his injection.  Please resume Plavix as soon as possible postprocedure, at the discretion of the surgeon. ? ?A copy of this note will be routed to requesting surgeon. ? ?Time:   ?Today, I have spent 9 minutes with the patient with telehealth technology  discussing medical history, symptoms, and management plan.   ? ? ?Lenna Sciara, NP ? ?07/15/2021, 11:11 AM ? ?

## 2021-07-21 ENCOUNTER — Ambulatory Visit
Admission: RE | Admit: 2021-07-21 | Discharge: 2021-07-21 | Disposition: A | Discharge status: Home / Self Care | Payer: Medicare Other | Source: Ambulatory Visit | Attending: Student | Admitting: Student

## 2021-07-21 DIAGNOSIS — M5126 Other intervertebral disc displacement, lumbar region: Secondary | ICD-10-CM | POA: Diagnosis not present

## 2021-07-21 DIAGNOSIS — M5416 Radiculopathy, lumbar region: Secondary | ICD-10-CM

## 2021-07-21 DIAGNOSIS — M47817 Spondylosis without myelopathy or radiculopathy, lumbosacral region: Secondary | ICD-10-CM | POA: Diagnosis not present

## 2021-07-21 MED ORDER — IOPAMIDOL (ISOVUE-M 200) INJECTION 41%
1.0000 mL | Freq: Once | INTRAMUSCULAR | Status: AC
  Administered 2021-07-21: 1 mL via EPIDURAL

## 2021-07-21 MED ORDER — METHYLPREDNISOLONE ACETATE 40 MG/ML INJ SUSP (RADIOLOG
80.0000 mg | Freq: Once | INTRAMUSCULAR | Status: AC
  Administered 2021-07-21: 80 mg via EPIDURAL

## 2021-07-21 NOTE — Discharge Instructions (Signed)
Post Procedure Spinal Discharge Instruction Sheet  You may resume a regular diet and any medications that you routinely take (including pain medications) unless otherwise noted by MD.  No driving day of procedure.  Light activity throughout the rest of the day.  Do not do any strenuous work, exercise, bending or lifting.  The day following the procedure, you can resume normal physical activity but you should refrain from exercising or physical therapy for at least three days thereafter.  You may apply ice to the injection site, 20 minutes on, 20 minutes off, as needed. Do not apply ice directly to skin.    Common Side Effects:  Headaches- take your usual medications as directed by your physician.  Increase your fluid intake.  Caffeinated beverages may be helpful.  Lie flat in bed until your headache resolves.  Restlessness or inability to sleep- you may have trouble sleeping for the next few days.  Ask your referring physician if you need any medication for sleep.  Facial flushing or redness- should subside within a few days.  Increased pain- a temporary increase in pain a day or two following your procedure is not unusual.  Take your pain medication as prescribed by your referring physician.  Leg cramps  Please contact our office at [phone] for the following symptoms: Fever greater than 100 degrees. Headaches unresolved with medication after 2-3 days. Increased swelling, pain, or redness at injection site.   Thank you for visiting [facility] today.    YOU MAY RESUME YOUR ASPIRIN AND PLAVIX IMMEDIATELY AFTER PROCEDURE TODAY

## 2021-08-11 ENCOUNTER — Other Ambulatory Visit: Payer: Self-pay | Admitting: Family Medicine

## 2021-08-11 DIAGNOSIS — M5416 Radiculopathy, lumbar region: Secondary | ICD-10-CM | POA: Diagnosis not present

## 2021-08-11 DIAGNOSIS — Z6828 Body mass index (BMI) 28.0-28.9, adult: Secondary | ICD-10-CM | POA: Diagnosis not present

## 2021-09-14 DIAGNOSIS — H2512 Age-related nuclear cataract, left eye: Secondary | ICD-10-CM | POA: Diagnosis not present

## 2021-10-01 ENCOUNTER — Other Ambulatory Visit: Payer: Self-pay | Admitting: Family Medicine

## 2021-10-01 DIAGNOSIS — B029 Zoster without complications: Secondary | ICD-10-CM

## 2021-10-01 DIAGNOSIS — B0223 Postherpetic polyneuropathy: Secondary | ICD-10-CM

## 2021-10-02 ENCOUNTER — Other Ambulatory Visit: Payer: Self-pay | Admitting: Cardiology

## 2021-10-15 DIAGNOSIS — H2512 Age-related nuclear cataract, left eye: Secondary | ICD-10-CM | POA: Diagnosis not present

## 2021-10-15 DIAGNOSIS — H269 Unspecified cataract: Secondary | ICD-10-CM | POA: Diagnosis not present

## 2021-11-09 ENCOUNTER — Other Ambulatory Visit: Payer: Self-pay | Admitting: Family Medicine

## 2021-11-09 DIAGNOSIS — B029 Zoster without complications: Secondary | ICD-10-CM

## 2021-11-09 DIAGNOSIS — B0223 Postherpetic polyneuropathy: Secondary | ICD-10-CM

## 2021-11-09 MED ORDER — GABAPENTIN 400 MG PO CAPS: 400.0000 mg | ORAL_CAPSULE | Freq: Three times a day (TID) | ORAL | 0 refills | Status: DC

## 2021-11-09 NOTE — Telephone Encounter (Signed)
Last OV 01/08/21. Last RF 07/01/20  Next OV 01/07/22  Ok to fill?

## 2021-11-09 NOTE — Telephone Encounter (Signed)
  Prescription Request  11/09/2021  Is this a "Controlled Substance" medicine? gabapentin (NEURONTIN) 400 MG capsule  Have you seen your PCP in the last 2 weeks? 01/07/2022 upcoming apt  If YES, route message to pool  -  If NO, patient needs to be scheduled for appointment.  What is the name of the medication or equipment? gabapentin (NEURONTIN) 400 MG capsule  Have you contacted your pharmacy to request a refill? Yes needs new rx   Which [facility] would you [facility]   Patient notified that their request is being sent to the clinical staff for review and that they should receive a response within 2 business days.

## 2021-11-23 ENCOUNTER — Other Ambulatory Visit: Payer: Self-pay | Admitting: Cardiology

## 2021-11-23 DIAGNOSIS — I25119 Atherosclerotic heart disease of native coronary artery with unspecified angina pectoris: Secondary | ICD-10-CM

## 2021-11-23 DIAGNOSIS — R600 Localized edema: Secondary | ICD-10-CM

## 2021-11-23 DIAGNOSIS — I10 Essential (primary) hypertension: Secondary | ICD-10-CM

## 2021-11-23 DIAGNOSIS — E7800 Pure hypercholesterolemia, unspecified: Secondary | ICD-10-CM

## 2021-12-07 ENCOUNTER — Telehealth: Payer: Self-pay | Admitting: Family Medicine

## 2021-12-10 ENCOUNTER — Other Ambulatory Visit: Payer: Self-pay

## 2021-12-10 ENCOUNTER — Ambulatory Visit (INDEPENDENT_AMBULATORY_CARE_PROVIDER_SITE_OTHER): Payer: Medicare Other

## 2021-12-10 DIAGNOSIS — Z0000 Encounter for general adult medical examination without abnormal findings: Secondary | ICD-10-CM | POA: Diagnosis not present

## 2021-12-10 DIAGNOSIS — R600 Localized edema: Secondary | ICD-10-CM

## 2021-12-10 DIAGNOSIS — I10 Essential (primary) hypertension: Secondary | ICD-10-CM

## 2021-12-10 DIAGNOSIS — E7800 Pure hypercholesterolemia, unspecified: Secondary | ICD-10-CM

## 2021-12-10 DIAGNOSIS — I25119 Atherosclerotic heart disease of native coronary artery with unspecified angina pectoris: Secondary | ICD-10-CM

## 2021-12-10 MED ORDER — HYDROCHLOROTHIAZIDE 25 MG PO TABS: ORAL_TABLET | ORAL | 2 refills | Status: DC

## 2021-12-10 NOTE — Progress Notes (Signed)
MEDICARE ANNUAL WELLNESS VISIT  12/10/2021  Telephone Visit Disclaimer This Medicare AWV was conducted by telephone due to national recommendations for restrictions regarding the COVID-19 Pandemic (e.g. social distancing).  I verified, using two identifiers, that I am speaking with Nathan Ayala. or their authorized healthcare agent. I discussed the limitations, risks, security, and privacy concerns of performing an evaluation and management service by telephone and the potential availability of an in-person appointment in the future. The patient expressed understanding and agreed to proceed.  Location of Patient: Home Location of Provider (nurse):  [facility]  Subjective:    Nathan Ayala. is a 76 y.o. male patient of Dettinger, Fransisca Kaufmann, MD who had a Medicare Annual Wellness Visit today via telephone. Brewer is a very pleasant man. 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 He and his wife live on an old [place] farm but currently nothing is still being grown on it. He has to maintain the grass and hay and stays busy mowing and bush hogging. He also maintains the church grounds. In between that he stays busy with all the members of his church. He and his wife have 3 children, a daughter that lives in [place], and 2 sons that live in [place].    Patient Care Team: Dettinger, Fransisca Kaufmann, MD as PCP - General (Family Medicine) Sueanne Margarita, MD as PCP - Cardiology (Cardiology)     12/10/2021    9:36 AM 06/08/2021   10:30 AM 12/09/2020    9:06 AM 12/22/2018    1:33 PM 05/23/2018   10:43 AM 09/28/2015    5:51 PM  Advanced Directives  Does Patient Have a Medical Advance Directive? No No No No No No  Would patient like information on creating a medical advance directive? No - Patient declined No - Patient declined No - Patient declined   No - patient declined  information    Hospital Utilization Over the Past 12 Months: # of hospitalizations or ER visits: 0 # of surgeries: 0  Review of Systems    Patient reports that his overall health is unchanged compared to last year.  History obtained from chart review  Patient Reported Readings (BP, Pulse, CBG, Weight, etc) none  Pain Assessment Pain : No/denies pain     Current Medications & Allergies (verified) Allergies as of 12/10/2021   No Known Allergies      Medication List        Accurate as of December 10, 2021  9:48 AM. If you have any questions, ask your nurse or doctor.          aspirin 81 MG tablet Take 81 mg by mouth daily.   clopidogrel 75 MG tablet Commonly known as: PLAVIX TAKE 1 TABLET DAILY   CoQ-10 200 MG Caps Take 1 capsule by mouth daily.   cyanocobalamin 1000 MCG tablet Commonly known as: VITAMIN B12 Take 1,000 mcg by mouth daily.   D3-1000 25 MCG (1000 UT) capsule Generic drug: Cholecalciferol Take 1,000 Units by mouth daily.   esomeprazole 40 MG capsule Commonly known as: NEXIUM TAKE 1 CAPSULE DAILY   gabapentin 400 MG capsule Commonly known as: NEURONTIN Take 1 capsule (400 mg total) by mouth 3 (three) times daily.   hydrochlorothiazide 25 MG tablet Commonly known as: HYDRODIURIL TAKE ONE (1) TABLET EACH DAY  isosorbide mononitrate 30 MG 24 hr tablet Commonly known as: IMDUR Take 1 tablet (30 mg total) by mouth 2 (two) times daily.   nitroGLYCERIN 0.4 MG SL tablet Commonly known as: NITROSTAT DISSOLVE 1 TABLET UNDER TONGUE FOR CHESTPAIN. MAY REPEAT EVERY 5 MINUTES FOR 3 DOSES. IF NO RELIEF CALL 911 OR GO TO ER   Omega 3 1200 MG Caps Take 1,200-2,400 mg by mouth 3 (three) times daily. Takes 2 caps in am, 2 caps at lunch, 1 cap in evening   predniSONE 10 MG tablet Commonly known as: DELTASONE Take 5 daily for 2 days followed by 4,3,2 and 1 for 2 days each.   rosuvastatin 10 MG tablet Commonly known as: CRESTOR Take 1 tablet (10  mg total) by mouth daily.   vitamin C 1000 MG tablet Take 1,000 mg by mouth daily.   zinc gluconate 50 MG tablet Take 50 mg by mouth daily.        History (reviewed): Past Medical History:  Diagnosis Date   Atypical chest pain    Chronic Secondary to musculoskeletal disease of cervical spine followed by Dr. Jannifer Franklin   Benign essential HTN 07/30/2014   Chronic chest wall pain 11/25/2016   Coronary artery disease 08/2003   s/p inferior lateral myocardial infarction with PCI of Proximal RCA. He is also s/p PTCA stenting of Proximal LAD September 2006   Edema extremities 08/01/2017   Hyperlipidemia    Stable angina    Past Surgical History:  Procedure Laterality Date   arthroscopic knee surgery     CARDIAC CATHETERIZATION     CORONARY ANGIOPLASTY     heart attack     KIDNEY STONE SURGERY     REPAIR OF PERFORATED ULCER     VASECTOMY     Family History  Problem Relation Age of Onset   Kidney cancer Mother    Hypertension Mother    Diabetes Mother    Cancer Mother    Cancer Father    Social History   Socioeconomic History   Marital status: Married    Spouse name: Judson Roch   Number of children: 3   Years of education: Not on file   Highest education level: Master's degree (e.g., MA, MS, MEng, MEd, MSW, MBA)  Occupational History   Occupation: pastor  Tobacco Use   Smoking status: Former    Packs/day: 1.50    Years: 10.00    Total pack years: 15.00    Types: Cigarettes    Quit date: 03/01/1970    Years since quitting: 51.8   Smokeless tobacco: Never  Vaping Use   Vaping Use: Never used  Substance and Sexual Activity   Alcohol use: No    Alcohol/week: 0.0 standard drinks of alcohol   Drug use: No   Sexual activity: Not Currently  Other Topics Concern   Not on file  Social History Narrative   82 years, 3 children, 3 grandchildren   He is a Environmental education officer. 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   Social Determinants of  Health   Financial Resource Strain: Low Risk  (12/09/2020)   Overall Financial Resource Strain (CARDIA)    Difficulty of Paying Living Expenses: Not hard at all  Food Insecurity: No Food Insecurity (12/09/2020)   Hunger Vital Sign    Worried About Running Out of Food in the Last Year: Never true    [place] in the Last Year:  Never true  Transportation Needs: No Transportation Needs (12/09/2020)   PRAPARE - Hydrologist (Medical): No    Lack of Transportation (Non-Medical): No  Physical Activity: Insufficiently Active (12/09/2020)   Exercise Vital Sign    Days of Exercise per Week: 7 days    Minutes of Exercise per Session: 20 min  Stress: No Stress Concern Present (12/09/2020)   [place]    Feeling of Stress : Not at all  Social Connections: [place] (12/09/2020)   Social Connection and Isolation Panel [NHANES]    Frequency of Communication with Friends and Family: More than three times a week    Frequency of Social Gatherings with Friends and Family: More than three times a week    Attends Religious Services: More than 4 times per year    Active Member of [employer] or Organizations: Yes    Attends Archivist Meetings: More than 4 times per year    Marital Status: Married    Activities of Daily Living    12/10/2021    9:38 AM  In your present state of health, do you have any difficulty performing the following activities:  Hearing? 0  Vision? 0  Difficulty concentrating or making decisions? 0  Walking or climbing stairs? 0  Dressing or bathing? 0  Doing errands, shopping? 0  Preparing Food and eating ? N  Using the Toilet? N  In the past six months, have you accidently leaked urine? N  Do you have problems with loss of bowel control? N  Managing your Medications? N  Managing your Finances? N  Housekeeping or managing your Housekeeping? N    Patient  Education/ Literacy How often do you need to have someone help you when you read instructions, pamphlets, or other written materials from your doctor or pharmacy?: 1 - Never What is the last grade level you completed in school?: Masters Degree in theology  Exercise Current Exercise Habits: The patient does not participate in regular exercise at present, Exercise limited by: None identified  Diet Patient reports consuming 3 meals a day and 1 snack(s) a day Patient reports that his primary diet is: Regular Patient reports that she does have regular access to food.   Depression Screen    12/10/2021    9:38 AM 06/12/2021    9:35 AM 01/08/2021   11:30 AM 12/29/2020    2:15 PM 12/09/2020    9:05 AM 06/24/2020    1:04 PM 12/20/2019    1:05 PM  PHQ 2/9 Scores  PHQ - 2 Score 0 0 0 0 0 0 0  PHQ- 9 Score   0 0        Fall Risk    12/10/2021    9:38 AM 06/12/2021    9:35 AM 01/08/2021   11:29 AM 12/29/2020    2:13 PM 12/09/2020    9:10 AM  Fall Risk   Falls in the past year? 0 0 1 1 0  Number falls in past yr:   0 0 0  Injury with Fall?   1 1 0  Risk for fall due to :   Impaired balance/gait History of fall(s) Medication side effect  Follow up   Falls evaluation completed Falls evaluation completed Falls prevention discussed     Objective:  Nathan Ayala. seemed alert and oriented and he participated appropriately during our telephone visit.  Blood Pressure Weight BMI  BP Readings  from Last 3 Encounters:  07/21/21 126/74  06/12/21 127/79  06/08/21 (!) 147/82   Wt Readings from Last 3 Encounters:  06/12/21 201 lb 12.8 oz (91.5 kg)  01/08/21 207 lb (93.9 kg)  12/29/20 210 lb 6 oz (95.4 kg)   BMI Readings from Last 1 Encounters:  06/12/21 29.80 kg/m    *Unable to obtain current vital signs, weight, and BMI due to telephone visit type  Hearing/Vision  Jenesis did not seem to have difficulty with hearing/understanding during the telephone conversation Reports that he has  had a formal eye exam by an eye care professional within the past year Reports that he has not had a formal hearing evaluation within the past year *Unable to fully assess hearing and vision during telephone visit type  Cognitive Function:    12/10/2021    9:39 AM 12/22/2018    1:38 PM  6CIT Screen  What Year? 0 points 0 points  What month? 0 points 0 points  What time? 0 points 0 points  Count back from 20 0 points 0 points  Months in reverse 0 points 0 points  Repeat phrase 0 points 0 points  Total Score 0 points 0 points   (Normal:0-7, Significant for Dysfunction: >8)  Normal Cognitive Function Screening: Yes   Immunization & Health Maintenance Record Immunization History  Administered Date(s) Administered   Fluad Quad(high Dose 65+) 12/13/2018, 12/20/2019, 01/08/2021   Influenza, High Dose Seasonal PF 01/05/2018   Pneumococcal Conjugate-13 12/13/2018   Pneumococcal Polysaccharide-23 12/20/2019   Td 12/18/2020    Health Maintenance  Topic Date Due   COVID-19 Vaccine (1) 01/04/2022 (Originally 10/09/1950)   Hepatitis C Screening  01/08/2022 (Originally 10/09/1963)   Zoster Vaccines- Shingrix (1 of 2) 03/12/2022 (Originally 10/10/1964)   INFLUENZA VACCINE  05/30/2022 (Originally 09/29/2021)   COLONOSCOPY (Pts 45-54yr Insurance coverage will need to be confirmed)  04/04/2022   TETANUS/TDAP  12/19/2030   Pneumonia Vaccine 76 Years old  Completed   HPV VACCINES  Aged Out       Assessment  This is a routine wellness examination for [employer]Marland Kitchen Health Maintenance: Due or Overdue There are no preventive care reminders to display for this patient.  FAlen Ayala does not need a referral for Community Assistance: Care Management:   no Social Work:    no Prescription Assistance:  no Nutrition/Diabetes Education:  no   Plan:  Personalized Goals  Goals Addressed             This Visit's Progress    DIET - EAT MORE FRUITS AND VEGETABLES       Exercise  150 min/wk Moderate Activity         Personalized Health Maintenance & Screening Recommendations  Influenza vaccine  Lung Cancer Screening Recommended: no (Low Dose CT Chest recommended if Age 78-80years, 30 pack-year currently smoking OR have quit w/in past 15 years) Hepatitis C Screening recommended: yes HIV Screening recommended: no  Advanced Directives: Written information was not prepared per patient's request.  Referrals & Orders No orders of the defined types were placed in this encounter.   Follow-up Plan Follow-up with Dettinger, JFransisca Kaufmann MD as planned Schedule for yearly flu vaccine and considering shingrix vaccine    I have personally reviewed and noted the following in the patient's chart:   Medical and social history Use of alcohol, tobacco or illicit drugs  Current medications and supplements Functional ability and status Nutritional status Physical activity Advanced directives List  of other physicians Hospitalizations, surgeries, and ER visits in previous 12 months Vitals Screenings to include cognitive, depression, and falls Referrals and appointments  In addition, I have reviewed and discussed with Nathan Ayala. certain preventive protocols, quality metrics, and best practice recommendations. A written personalized care plan for preventive services as well as general preventive health recommendations is available and can be mailed to the patient at his request.      Rolena Infante LPN [id number]

## 2021-12-24 ENCOUNTER — Ambulatory Visit: Payer: Medicare Other | Admitting: Family Medicine

## 2022-01-06 ENCOUNTER — Other Ambulatory Visit: Payer: Self-pay | Admitting: Cardiology

## 2022-01-06 DIAGNOSIS — I10 Essential (primary) hypertension: Secondary | ICD-10-CM

## 2022-01-06 DIAGNOSIS — R600 Localized edema: Secondary | ICD-10-CM

## 2022-01-06 DIAGNOSIS — I25119 Atherosclerotic heart disease of native coronary artery with unspecified angina pectoris: Secondary | ICD-10-CM

## 2022-01-06 DIAGNOSIS — E7800 Pure hypercholesterolemia, unspecified: Secondary | ICD-10-CM

## 2022-01-07 ENCOUNTER — Ambulatory Visit (INDEPENDENT_AMBULATORY_CARE_PROVIDER_SITE_OTHER): Payer: Medicare Other | Admitting: Family Medicine

## 2022-01-07 ENCOUNTER — Encounter: Payer: Self-pay | Admitting: Family Medicine

## 2022-01-07 ENCOUNTER — Ambulatory Visit: Payer: Medicare Other | Admitting: Family Medicine

## 2022-01-07 VITALS — BP 110/69 | HR 65 | Temp 98.7°F | Ht 69.0 in | Wt 201.0 lb

## 2022-01-07 DIAGNOSIS — Z23 Encounter for immunization: Secondary | ICD-10-CM

## 2022-01-07 DIAGNOSIS — B0223 Postherpetic polyneuropathy: Secondary | ICD-10-CM | POA: Diagnosis not present

## 2022-01-07 DIAGNOSIS — I10 Essential (primary) hypertension: Secondary | ICD-10-CM | POA: Diagnosis not present

## 2022-01-07 DIAGNOSIS — B029 Zoster without complications: Secondary | ICD-10-CM

## 2022-01-07 DIAGNOSIS — E7800 Pure hypercholesterolemia, unspecified: Secondary | ICD-10-CM | POA: Diagnosis not present

## 2022-01-07 DIAGNOSIS — I25119 Atherosclerotic heart disease of native coronary artery with unspecified angina pectoris: Secondary | ICD-10-CM

## 2022-01-07 MED ORDER — ESOMEPRAZOLE MAGNESIUM 40 MG PO CPDR: 40.0000 mg | DELAYED_RELEASE_CAPSULE | Freq: Every day | ORAL | 3 refills | Status: DC

## 2022-01-07 MED ORDER — GABAPENTIN 400 MG PO CAPS: 400.0000 mg | ORAL_CAPSULE | Freq: Three times a day (TID) | ORAL | 3 refills | Status: DC

## 2022-01-07 NOTE — Progress Notes (Signed)
BP 110/69   Pulse 65   Temp 98.7 F (37.1 C)   Ht _0  (1.753 m)   Wt 201 lb (91.2 kg)   SpO2 94%   BMI 29.68 kg/m    Subjective:   Patient ID: Nathan Ayala., male    DOB: 10/05/1945, 76 y.o.   MRN: [id number]  HPI: Nathan Ayala. is a 76 y.o. male presenting on 01/07/2022 for Annual Exam   HPI Annual exam Patient denies any chest pain, shortness of breath, headaches or vision issues, abdominal complaints, diarrhea, nausea, vomiting, or joint issues.   Hypertension Patient is currently on hydrochlorothiazide and Imdur, and their blood pressure today is 110/69. Patient denies any lightheadedness or dizziness. Patient denies headaches, blurred vision, chest pains, shortness of breath, or weakness. Denies any side effects from medication and is content with current medication.   Hyperlipidemia and CAD recheck Patient is coming in for recheck of his hyperlipidemia. The patient is currently taking Crestor. They deny any issues with myalgias or history of liver damage from it. They deny any focal numbness or weakness or chest pain.  Patient has postherpetic neuralgia and takes gabapentin to help with this.  It has not worsened.  Relevant past medical, surgical, family and social history reviewed and updated as indicated. Interim medical history since our last visit reviewed. Allergies and medications reviewed and updated.  Review of Systems  Constitutional:  Negative for chills and fever.  HENT:  Negative for ear pain and tinnitus.   Eyes:  Negative for pain and visual disturbance.  Respiratory:  Negative for cough, shortness of breath and wheezing.   Cardiovascular:  Negative for chest pain, palpitations and leg swelling.  Gastrointestinal:  Negative for abdominal pain, blood in stool, constipation and diarrhea.  Genitourinary:  Negative for dysuria and hematuria.  Musculoskeletal:  Negative for back pain, gait problem and myalgias.  Skin:  Negative for rash.   Neurological:  Negative for dizziness, weakness and headaches.  Psychiatric/Behavioral:  Negative for suicidal ideas.   All other systems reviewed and are negative.   Per HPI unless specifically indicated above   Allergies as of 01/07/2022   No Known Allergies      Medication List        Accurate as of January 07, 2022  2:37 PM. If you have any questions, ask your nurse or doctor.          STOP taking these medications    predniSONE 10 MG tablet Commonly known as: DELTASONE Stopped by: Fransisca Kaufmann Elisah Parmer, MD       TAKE these medications    aspirin 81 MG tablet Take 81 mg by mouth daily.   clopidogrel 75 MG tablet Commonly known as: PLAVIX TAKE 1 TABLET DAILY   CoQ-10 200 MG Caps Take 1 capsule by mouth daily.   cyanocobalamin 1000 MCG tablet Commonly known as: VITAMIN B12 Take 1,000 mcg by mouth daily.   D3-1000 25 MCG (1000 UT) capsule Generic drug: Cholecalciferol Take 1,000 Units by mouth daily.   esomeprazole 40 MG capsule Commonly known as: NEXIUM Take 1 capsule (40 mg total) by mouth daily.   gabapentin 400 MG capsule Commonly known as: NEURONTIN Take 1 capsule (400 mg total) by mouth 3 (three) times daily.   hydrochlorothiazide 25 MG tablet Commonly known as: HYDRODIURIL TAKE ONE (1) TABLET EACH DAY   isosorbide mononitrate 30 MG 24 hr tablet Commonly known as: IMDUR TAKE 1 TABLET TWICE A DAY   nitroGLYCERIN  0.4 MG SL tablet Commonly known as: NITROSTAT DISSOLVE 1 TABLET UNDER TONGUE FOR CHESTPAIN. MAY REPEAT EVERY 5 MINUTES FOR 3 DOSES. IF NO RELIEF CALL 911 OR GO TO ER   Omega 3 1200 MG Caps Take 1,200-2,400 mg by mouth 3 (three) times daily. Takes 2 caps in am, 2 caps at lunch, 1 cap in evening   rosuvastatin 10 MG tablet Commonly known as: CRESTOR Take 1 tablet (10 mg total) by mouth daily.   vitamin C 1000 MG tablet Take 1,000 mg by mouth daily.   zinc gluconate 50 MG tablet Take 50 mg by mouth daily.          Objective:   BP 110/69   Pulse 65   Temp 98.7 F (37.1 C)   Ht _0  (1.753 m)   Wt 201 lb (91.2 kg)   SpO2 94%   BMI 29.68 kg/m   Wt Readings from Last 3 Encounters:  01/07/22 201 lb (91.2 kg)  06/12/21 201 lb 12.8 oz (91.5 kg)  01/08/21 207 lb (93.9 kg)    Physical Exam Vitals reviewed.  Constitutional:      General: He is not in acute distress.    Appearance: He is well-developed. He is not diaphoretic.  HENT:     Right Ear: External ear normal.     Left Ear: External ear normal.     Nose: Nose normal.     Mouth/Throat:     Pharynx: No oropharyngeal exudate.  Eyes:     General: No scleral icterus.       Right eye: No discharge.     Conjunctiva/sclera: Conjunctivae normal.     Pupils: Pupils are equal, round, and reactive to light.  Neck:     Thyroid: No thyromegaly.  Cardiovascular:     Rate and Rhythm: Normal rate and regular rhythm.     Heart sounds: Normal heart sounds. No murmur heard. Pulmonary:     Effort: Pulmonary effort is normal. No respiratory distress.     Breath sounds: Normal breath sounds. No wheezing.  Abdominal:     General: Bowel sounds are normal. There is no distension.     Palpations: Abdomen is soft.     Tenderness: There is no abdominal tenderness. There is no guarding or rebound.  Musculoskeletal:        General: Normal range of motion.     Cervical back: Neck supple.  Lymphadenopathy:     Cervical: No cervical adenopathy.  Skin:    General: Skin is warm and dry.     Findings: No rash.  Neurological:     Mental Status: He is alert and oriented to person, place, and time.     Coordination: Coordination normal.  Psychiatric:        Behavior: Behavior normal.       Assessment & Plan:   Problem List Items Addressed This Visit       Cardiovascular and Mediastinum   Benign essential HTN - Primary   Relevant Orders   CBC with Differential/Platelet   CMP14+EGFR   Lipid panel   Coronary artery disease involving native  coronary artery of native heart with angina pectoris ([place])   Relevant Orders   CBC with Differential/Platelet   CMP14+EGFR   Lipid panel     Nervous and Auditory   Shingles (herpes zoster) polyneuropathy   Relevant Medications   gabapentin (NEURONTIN) 400 MG capsule     Other   Pure hypercholesterolemia   Relevant Orders  CBC with Differential/Platelet   CMP14+EGFR   Lipid panel   Other Visit Diagnoses     Need for immunization against influenza       Relevant Orders   Flu Vaccine QUAD High Dose(Fluad) (Completed)   Herpes zoster without complication       Relevant Medications   gabapentin (NEURONTIN) 400 MG capsule       Seems to be doing well, no changes.  He lowered his gabapentin down to where he just takes 2 a day. Follow up plan: Return in about 1 year (around 01/08/2023), or if symptoms worsen or fail to improve, for physical.  Counseling provided for all of the vaccine components Orders Placed This Encounter  Procedures   Flu Vaccine QUAD High Dose(Fluad)   CBC with Differential/Platelet   CMP14+EGFR   Lipid panel    Caryl Pina, MD [facility] 01/07/2022, 2:37 PM

## 2022-01-08 LAB — CBC WITH DIFFERENTIAL/PLATELET
Basophils Absolute: 0 10*3/uL (ref 0.0–0.2)
Basos: 0 %
EOS (ABSOLUTE): 0.1 10*3/uL (ref 0.0–0.4)
Eos: 1 %
Hematocrit: 52 % — ABNORMAL HIGH (ref 37.5–51.0)
Hemoglobin: 17.6 g/dL (ref 13.0–17.7)
Immature Grans (Abs): 0 10*3/uL (ref 0.0–0.1)
Immature Granulocytes: 0 %
Lymphocytes Absolute: 3 10*3/uL (ref 0.7–3.1)
Lymphs: 39 %
MCH: 29.2 pg (ref 26.6–33.0)
MCHC: 33.8 g/dL (ref 31.5–35.7)
MCV: 86 fL (ref 79–97)
Monocytes Absolute: 0.7 10*3/uL (ref 0.1–0.9)
Monocytes: 9 %
Neutrophils Absolute: 3.8 10*3/uL (ref 1.4–7.0)
Neutrophils: 51 %
Platelets: 212 10*3/uL (ref 150–450)
RBC: 6.02 x10E6/uL — ABNORMAL HIGH (ref 4.14–5.80)
RDW: 13.2 % (ref 11.6–15.4)
WBC: 7.5 10*3/uL (ref 3.4–10.8)

## 2022-01-08 LAB — CMP14+EGFR
ALT: 18 IU/L (ref 0–44)
AST: 19 IU/L (ref 0–40)
Albumin/Globulin Ratio: 2 (ref 1.2–2.2)
Albumin: 4.5 g/dL (ref 3.8–4.8)
Alkaline Phosphatase: 71 IU/L (ref 44–121)
BUN/Creatinine Ratio: 12 (ref 10–24)
BUN: 14 mg/dL (ref 8–27)
Bilirubin Total: 2 mg/dL — ABNORMAL HIGH (ref 0.0–1.2)
CO2: 21 mmol/L (ref 20–29)
Calcium: 9.8 mg/dL (ref 8.6–10.2)
Chloride: 103 mmol/L (ref 96–106)
Creatinine, Ser: 1.19 mg/dL (ref 0.76–1.27)
Globulin, Total: 2.2 g/dL (ref 1.5–4.5)
Glucose: 95 mg/dL (ref 70–99)
Potassium: 4 mmol/L (ref 3.5–5.2)
Sodium: 141 mmol/L (ref 134–144)
Total Protein: 6.7 g/dL (ref 6.0–8.5)
eGFR: 63 mL/min/{1.73_m2} (ref >59)

## 2022-01-08 LAB — LIPID PANEL
Chol/HDL Ratio: 3.2 ratio (ref 0.0–5.0)
Cholesterol, Total: 120 mg/dL (ref 100–199)
HDL: 38 mg/dL — ABNORMAL LOW (ref >39)
LDL Chol Calc (NIH): 62 mg/dL (ref 0–99)
Triglycerides: 111 mg/dL (ref 0–149)
VLDL Cholesterol Cal: 20 mg/dL (ref 5–40)

## 2022-01-18 ENCOUNTER — Other Ambulatory Visit: Payer: Self-pay | Admitting: Cardiology

## 2022-01-18 DIAGNOSIS — E7800 Pure hypercholesterolemia, unspecified: Secondary | ICD-10-CM

## 2022-01-18 DIAGNOSIS — I10 Essential (primary) hypertension: Secondary | ICD-10-CM

## 2022-01-18 DIAGNOSIS — I25119 Atherosclerotic heart disease of native coronary artery with unspecified angina pectoris: Secondary | ICD-10-CM

## 2022-01-18 DIAGNOSIS — R600 Localized edema: Secondary | ICD-10-CM

## 2022-02-01 ENCOUNTER — Ambulatory Visit: Payer: Medicare Other | Admitting: Family Medicine

## 2022-03-10 ENCOUNTER — Ambulatory Visit: Payer: Medicare Other | Attending: Cardiology | Admitting: Cardiology

## 2022-03-10 ENCOUNTER — Encounter: Payer: Self-pay | Admitting: Cardiology

## 2022-03-10 VITALS — BP 128/78 | HR 67 | Ht 69.0 in | Wt 202.0 lb

## 2022-03-10 DIAGNOSIS — E7800 Pure hypercholesterolemia, unspecified: Secondary | ICD-10-CM

## 2022-03-10 DIAGNOSIS — I10 Essential (primary) hypertension: Secondary | ICD-10-CM | POA: Insufficient documentation

## 2022-03-10 DIAGNOSIS — I25119 Atherosclerotic heart disease of native coronary artery with unspecified angina pectoris: Secondary | ICD-10-CM | POA: Diagnosis not present

## 2022-03-10 DIAGNOSIS — R600 Localized edema: Secondary | ICD-10-CM | POA: Insufficient documentation

## 2022-03-10 NOTE — Progress Notes (Signed)
History of Present Illness:       Date:  03/10/2022   ID:  Nathan Ayala., DOB March 04, 1945, MRN [id number]   PCP:  Dettinger, Fransisca Kaufmann, MD  Cardiologist:  Fransico Him, MD  Electrophysiologist:  None   Chief Complaint:  CAD, HLD, HTN  History of Present Illness:    Nathan Ayala is a 77 y.o. male with a hx of ASCAD (s/p inferior lateral myocardial infarction with PCI of Proximal RCA. He is also s/p PTCA stenting of Proximal LAD September 2006), chronic chest wall pain, dyslipidemia and HTN. He has chronic stable angina that usually occurs if he works too hard out in the heat in his yard.  It usually resolves with one sublingual nitroglycerin.   Nuclear stress test 11/2015 showed no inducible ischemia.  He is here today for followup and is doing well.  He denies any chest pain or pressure, SOB, DOE, PND, orthopnea, LE edema, dizziness (unless he bends over and stands back up too fast), palpitations or syncope. He is compliant with his meds and is tolerating meds with no SE.    Prior CV studies:   The following studies were reviewed today:  EKG  Past Medical History:  Diagnosis Date   Atypical chest pain    Chronic Secondary to musculoskeletal disease of cervical spine followed by Dr. Jannifer Franklin   Benign essential HTN 07/30/2014   Chronic chest wall pain 11/25/2016   Coronary artery disease 08/2003   s/p inferior lateral myocardial infarction with PCI of Proximal RCA. He is also s/p PTCA stenting of Proximal LAD September 2006   Edema extremities 08/01/2017   Hyperlipidemia    Stable angina    Past Surgical History:  Procedure Laterality Date   arthroscopic knee surgery     CARDIAC CATHETERIZATION     CORONARY ANGIOPLASTY     heart attack     KIDNEY STONE SURGERY     REPAIR OF PERFORATED ULCER     VASECTOMY       Current Meds  Medication Sig   Ascorbic Acid (VITAMIN C) 1000 MG tablet Take 1,000 mg by mouth daily.   aspirin 81 MG tablet Take 81 mg by mouth daily.    Cholecalciferol (D3-1000) 25 MCG (1000 UT) capsule Take 1,000 Units by mouth daily.   clopidogrel (PLAVIX) 75 MG tablet TAKE 1 TABLET DAILY   Coenzyme Q10 (COQ-10) 200 MG CAPS Take 1 capsule by mouth daily.   esomeprazole (NEXIUM) 40 MG capsule Take 1 capsule (40 mg total) by mouth daily.   gabapentin (NEURONTIN) 400 MG capsule Take 1 capsule (400 mg total) by mouth 3 (three) times daily.   hydrochlorothiazide (HYDRODIURIL) 25 MG tablet TAKE ONE (1) TABLET EACH DAY   isosorbide mononitrate (IMDUR) 30 MG 24 hr tablet TAKE 1 TABLET TWICE A DAY   nitroGLYCERIN (NITROSTAT) 0.4 MG SL tablet DISSOLVE 1 TABLET UNDER TONGUE FOR CHESTPAIN. MAY REPEAT EVERY 5 MINUTES FOR 3 DOSES. IF NO RELIEF CALL 911 OR GO TO ER   Omega 3 1200 MG CAPS Take 1,200-2,400 mg by mouth 3 (three) times daily. Takes 2 caps in am, 2 caps at lunch, 1 cap in evening   rosuvastatin (CRESTOR) 10 MG tablet Take 1 tablet (10 mg total) by mouth daily.   vitamin B-12 (CYANOCOBALAMIN) 1000 MCG tablet Take 1,000 mcg by mouth daily.   zinc gluconate 50 MG tablet Take 50 mg by mouth daily.     Allergies:   Patient has no known  allergies.   Social History   Tobacco Use   Smoking status: Former    Packs/day: 1.50    Years: 10.00    Total pack years: 15.00    Types: Cigarettes    Quit date: 03/01/1970    Years since quitting: 52.0   Smokeless tobacco: Never  Vaping Use   Vaping Use: Never used  Substance Use Topics   Alcohol use: No    Alcohol/week: 0.0 standard drinks of alcohol   Drug use: No     Family Hx: The patient's family history includes Cancer in his father and mother; Diabetes in his mother; Hypertension in his mother; Kidney cancer in his mother.  ROS:   Please see the history of present illness.    none All other systems reviewed and are negative.   Labs/Other Tests and Data Reviewed:    Recent Labs: 01/07/2022: ALT 18; BUN 14; Creatinine, Ser 1.19; Hemoglobin 17.6; Platelets 212; Potassium 4.0; Sodium 141    Recent Lipid Panel Lab Results  Component Value Date/Time   CHOL 120 01/07/2022 02:48 PM   TRIG 111 01/07/2022 02:48 PM   HDL 38 (L) 01/07/2022 02:48 PM   CHOLHDL 3.2 01/07/2022 02:48 PM   CHOLHDL 3.7 01/31/2015 10:44 AM   LDLCALC 62 01/07/2022 02:48 PM    Wt Readings from Last 3 Encounters:  03/10/22 202 lb (91.6 kg)  01/07/22 201 lb (91.2 kg)  06/12/21 201 lb 12.8 oz (91.5 kg)     Objective:    Vital Signs:  BP 128/78   Pulse 67   Ht '5\' 9"'$  (1.753 m)   Wt 202 lb (91.6 kg)   SpO2 96%   BMI 29.83 kg/m    GEN: Well nourished, well developed in no acute distress HEENT: Normal NECK: No JVD; No carotid bruits LYMPHATICS: No lymphadenopathy CARDIAC:RRR, no murmurs, rubs, gallops RESPIRATORY:  Clear to auscultation without rales, wheezing or rhonchi  ABDOMEN: Soft, non-tender, non-distended MUSCULOSKELETAL:  No edema; No deformity  SKIN: Warm and dry NEUROLOGIC:  Alert and oriented x 3 PSYCHIATRIC:  Normal affect   EKG was performed today and demonstrates NSR with no ST changes ASSESSMENT & PLAN:      1.  ASCAD with chronic stable angina - s/p inferior lateral myocardial infarction with PCI of Proximal RCA. He is also s/p PTCA stenting of Proximal LAD September 2006.   -He continues to have chronic stable angina as well as noncardiac chest pain that is very stable on medical therapy.  He has not had to take any nitroglycerin in the past year and has not actually had any angina since I saw him last.   -Continue prescription drug management with aspirin 81 mg daily, Imdur 30 mg twice daily, Plavix 75 mg daily and statin therapy with as needed refills  2.  HTN  -BP is adequately controlled on exam -I have personally reviewed and interpreted outside labs performed by patient's PCP which showed SCr 1.190 and K+ 4 on 01/07/22 -Continue prescription drug management with HCTZ 25 mg daily with as needed refills   3.  HLD  -LDL goal < 70 -I have personally reviewed and  interpreted outside labs performed by patient's PCP which showed LDL 62 and HDL 38 on 01/07/2022 -Continue prescription drug managed with Crestor 10 mg daily and fish oil with as needed refills  4.  LE edema  -controlled on diuretics -Continue HCTZ 25 mg daily with as needed refills   Medication Adjustments/Labs and Tests Ordered: Current medicines are  reviewed at length with the patient today.  Concerns regarding medicines are outlined above.  Tests Ordered: No orders of the defined types were placed in this encounter.  Medication Changes: No orders of the defined types were placed in this encounter.   Disposition:  Follow up in 1 year(s)  Signed, Fransico Him, MD  10/31/2018 10:26 AM    [facility]

## 2022-03-10 NOTE — Patient Instructions (Signed)
Medication Instructions:  Your physician recommends that you continue on your current medications as directed. Please refer to the Current Medication list given to you today.  *If you need a refill on your cardiac medications before your next appointment, please call your pharmacy*   Lab Work: NONE  Testing/Procedures: NONE  Follow-Up: At [facility], you and your health needs are our priority.  As part of our continuing mission to provide you with exceptional heart care, we have created designated Provider Care Teams.  These Care Teams include your primary Cardiologist (physician) and Advanced Practice Providers (APPs -  Physician Assistants and Nurse Practitioners) who all work together to provide you with the care you need, when you need it.  Your next appointment:   1 year(s)  The format for your next appointment:   In Person  Provider:   Fransico Him, MD   Important Information About Sugar

## 2022-04-19 ENCOUNTER — Other Ambulatory Visit: Payer: Self-pay | Admitting: Cardiology

## 2022-04-19 DIAGNOSIS — R600 Localized edema: Secondary | ICD-10-CM

## 2022-04-19 DIAGNOSIS — I10 Essential (primary) hypertension: Secondary | ICD-10-CM

## 2022-04-19 DIAGNOSIS — E7800 Pure hypercholesterolemia, unspecified: Secondary | ICD-10-CM

## 2022-04-19 DIAGNOSIS — I25119 Atherosclerotic heart disease of native coronary artery with unspecified angina pectoris: Secondary | ICD-10-CM

## 2022-07-05 ENCOUNTER — Other Ambulatory Visit: Payer: Self-pay | Admitting: Cardiology

## 2022-07-05 DIAGNOSIS — I10 Essential (primary) hypertension: Secondary | ICD-10-CM

## 2022-07-05 DIAGNOSIS — I25119 Atherosclerotic heart disease of native coronary artery with unspecified angina pectoris: Secondary | ICD-10-CM

## 2022-07-05 DIAGNOSIS — R600 Localized edema: Secondary | ICD-10-CM

## 2022-07-05 DIAGNOSIS — E7800 Pure hypercholesterolemia, unspecified: Secondary | ICD-10-CM

## 2022-08-10 DIAGNOSIS — S51811A Laceration without foreign body of right forearm, initial encounter: Secondary | ICD-10-CM | POA: Diagnosis not present

## 2022-08-20 ENCOUNTER — Other Ambulatory Visit: Payer: Self-pay | Admitting: Cardiology

## 2022-08-20 DIAGNOSIS — I10 Essential (primary) hypertension: Secondary | ICD-10-CM

## 2022-08-20 DIAGNOSIS — R600 Localized edema: Secondary | ICD-10-CM

## 2022-08-20 DIAGNOSIS — E7800 Pure hypercholesterolemia, unspecified: Secondary | ICD-10-CM

## 2022-08-20 DIAGNOSIS — I25119 Atherosclerotic heart disease of native coronary artery with unspecified angina pectoris: Secondary | ICD-10-CM

## 2022-09-06 ENCOUNTER — Other Ambulatory Visit: Payer: Self-pay | Admitting: Cardiology

## 2022-09-06 DIAGNOSIS — I25119 Atherosclerotic heart disease of native coronary artery with unspecified angina pectoris: Secondary | ICD-10-CM

## 2022-09-06 DIAGNOSIS — E7800 Pure hypercholesterolemia, unspecified: Secondary | ICD-10-CM

## 2022-09-06 DIAGNOSIS — I10 Essential (primary) hypertension: Secondary | ICD-10-CM

## 2022-09-06 DIAGNOSIS — R600 Localized edema: Secondary | ICD-10-CM

## 2022-09-08 ENCOUNTER — Other Ambulatory Visit: Payer: Self-pay | Admitting: Family Medicine

## 2022-09-08 DIAGNOSIS — B029 Zoster without complications: Secondary | ICD-10-CM

## 2022-09-08 DIAGNOSIS — B0223 Postherpetic polyneuropathy: Secondary | ICD-10-CM

## 2022-11-16 ENCOUNTER — Other Ambulatory Visit: Payer: Self-pay

## 2022-11-16 MED ORDER — NITROGLYCERIN 0.4 MG SL SUBL: 0.4000 mg | SUBLINGUAL_TABLET | SUBLINGUAL | 4 refills | Status: DC | PRN

## 2022-11-16 NOTE — Telephone Encounter (Signed)
Pt's medication was sent to pt's pharmacy as requested. Confirmation received.  °

## 2023-01-11 DIAGNOSIS — D3132 Benign neoplasm of left choroid: Secondary | ICD-10-CM | POA: Diagnosis not present

## 2023-01-11 DIAGNOSIS — Z961 Presence of intraocular lens: Secondary | ICD-10-CM | POA: Diagnosis not present

## 2023-01-11 DIAGNOSIS — H524 Presbyopia: Secondary | ICD-10-CM | POA: Diagnosis not present

## 2023-01-13 ENCOUNTER — Encounter: Payer: Self-pay | Admitting: Family Medicine

## 2023-01-13 ENCOUNTER — Ambulatory Visit (INDEPENDENT_AMBULATORY_CARE_PROVIDER_SITE_OTHER): Payer: Medicare Other | Admitting: Family Medicine

## 2023-01-13 VITALS — BP 115/78 | HR 60 | Ht 69.0 in | Wt 206.0 lb

## 2023-01-13 DIAGNOSIS — K219 Gastro-esophageal reflux disease without esophagitis: Secondary | ICD-10-CM

## 2023-01-13 DIAGNOSIS — E559 Vitamin D deficiency, unspecified: Secondary | ICD-10-CM | POA: Diagnosis not present

## 2023-01-13 DIAGNOSIS — R600 Localized edema: Secondary | ICD-10-CM | POA: Diagnosis not present

## 2023-01-13 DIAGNOSIS — B029 Zoster without complications: Secondary | ICD-10-CM | POA: Diagnosis not present

## 2023-01-13 DIAGNOSIS — I25119 Atherosclerotic heart disease of native coronary artery with unspecified angina pectoris: Secondary | ICD-10-CM | POA: Diagnosis not present

## 2023-01-13 DIAGNOSIS — B0223 Postherpetic polyneuropathy: Secondary | ICD-10-CM | POA: Diagnosis not present

## 2023-01-13 DIAGNOSIS — E7800 Pure hypercholesterolemia, unspecified: Secondary | ICD-10-CM

## 2023-01-13 DIAGNOSIS — R351 Nocturia: Secondary | ICD-10-CM | POA: Diagnosis not present

## 2023-01-13 DIAGNOSIS — I10 Essential (primary) hypertension: Secondary | ICD-10-CM

## 2023-01-13 DIAGNOSIS — Z125 Encounter for screening for malignant neoplasm of prostate: Secondary | ICD-10-CM

## 2023-01-13 MED ORDER — ESOMEPRAZOLE MAGNESIUM 40 MG PO CPDR: 40.0000 mg | DELAYED_RELEASE_CAPSULE | Freq: Every day | ORAL | 3 refills | Status: AC

## 2023-01-13 MED ORDER — HYDROCHLOROTHIAZIDE 25 MG PO TABS: ORAL_TABLET | ORAL | 1 refills | Status: DC

## 2023-01-13 MED ORDER — GABAPENTIN 400 MG PO CAPS: 400.0000 mg | ORAL_CAPSULE | Freq: Three times a day (TID) | ORAL | 1 refills | Status: DC

## 2023-01-13 NOTE — Patient Instructions (Signed)
Dr. Kinnie Scales: [phone]. To schedule you colonoscopy

## 2023-01-13 NOTE — Addendum Note (Signed)
Addended by: Dorene Sorrow on: 01/13/2023 03:03 PM   Modules accepted: Orders

## 2023-01-13 NOTE — Progress Notes (Signed)
BP 115/78   Pulse 60   Ht 5\' 9"  (1.753 m)   Wt 206 lb (93.4 kg)   SpO2 96%   BMI 30.42 kg/m    Subjective:   Patient ID: Nathan Ayala., male    DOB: [birth year], 77 y.o.   MRN: [id number]  HPI: Nathan Ayala. is a 77 y.o. male presenting on 01/13/2023 for Medical Management of Chronic Issues (CPE)   HPI Hypertension Patient is currently on hydrochlorothiazide and Imdur, and their blood pressure today is 115/78. Patient denies any lightheadedness or dizziness. Patient denies headaches, blurred vision, chest pains, shortness of breath, or weakness. Denies any side effects from medication and is content with current medication.   Hyperlipidemia Patient is coming in for recheck of his hyperlipidemia. The patient is currently taking Crestor and fish oil. They deny any issues with myalgias or history of liver damage from it. They deny any focal numbness or weakness or chest pain.   GERD Patient is currently on Nexium.  She denies any major symptoms or abdominal pain or belching or burping. She denies any blood in her stool or lightheadedness or dizziness.   Neuropathy shingles Patient is coming in for recheck for neuropathy for shingles.  He is currently on gabapentin.  Vitamin D deficiency recheck today.  Relevant past medical, surgical, family and social history reviewed and updated as indicated. Interim medical history since our last visit reviewed. Allergies and medications reviewed and updated.  Review of Systems  Constitutional:  Negative for chills and fever.  Eyes:  Negative for visual disturbance.  Respiratory:  Negative for shortness of breath and wheezing.   Cardiovascular:  Negative for chest pain and leg swelling.  Musculoskeletal:  Negative for back pain and gait problem.  Skin:  Negative for rash.  All other systems reviewed and are negative.   Per HPI unless specifically indicated above   Allergies as of 01/13/2023   No Known Allergies       Medication List        Accurate as of January 13, 2023  2:56 PM. If you have any questions, ask your nurse or doctor.          aspirin 81 MG tablet Take 81 mg by mouth daily.   clopidogrel 75 MG tablet Commonly known as: PLAVIX Take 1 tablet (75 mg total) by mouth daily.   CoQ-10 200 MG Caps Take 1 capsule by mouth daily.   cyanocobalamin 1000 MCG tablet Commonly known as: VITAMIN B12 Take 1,000 mcg by mouth daily.   D3-1000 25 MCG (1000 UT) capsule Generic drug: Cholecalciferol Take 1,000 Units by mouth daily.   esomeprazole 40 MG capsule Commonly known as: NEXIUM Take 1 capsule (40 mg total) by mouth daily.   gabapentin 400 MG capsule Commonly known as: NEURONTIN Take 1 capsule (400 mg total) by mouth 3 (three) times daily.   hydrochlorothiazide 25 MG tablet Commonly known as: HYDRODIURIL TAKE 1 TABLET DAILY   isosorbide mononitrate 30 MG 24 hr tablet Commonly known as: IMDUR TAKE 1 TABLET TWICE A DAY   nitroGLYCERIN 0.4 MG SL tablet Commonly known as: NITROSTAT Place 1 tablet (0.4 mg total) under the tongue every 5 (five) minutes as needed for chest pain.   Omega 3 1200 MG Caps Take 1,200-2,400 mg by mouth 3 (three) times daily. Takes 2 caps in am, 2 caps at lunch, 1 cap in evening   rosuvastatin 10 MG tablet Commonly known as: CRESTOR TAKE 1 TABLET DAILY (  PLEASE KEEP UPCOMING APPOINTMENT WITH DOCTOR TURNER IN [place], IN ORDER TO RECEIVE FUTURE REFILLS, THANK YOU)   vitamin C 1000 MG tablet Take 1,000 mg by mouth daily.   zinc gluconate 50 MG tablet Take 50 mg by mouth daily.         Objective:   BP 115/78   Pulse 60   Ht 5\' 9"  (1.753 m)   Wt 206 lb (93.4 kg)   SpO2 96%   BMI 30.42 kg/m   Wt Readings from Last 3 Encounters:  01/13/23 206 lb (93.4 kg)  03/10/22 202 lb (91.6 kg)  01/07/22 201 lb (91.2 kg)    Physical Exam Vitals and nursing note reviewed.  Constitutional:      General: He is not in acute distress.    Appearance:  He is well-developed. He is not diaphoretic.  Eyes:     General: No scleral icterus.    Conjunctiva/sclera: Conjunctivae normal.  Neck:     Thyroid: No thyromegaly.  Cardiovascular:     Rate and Rhythm: Normal rate and regular rhythm.     Heart sounds: Normal heart sounds. No murmur heard. Pulmonary:     Effort: Pulmonary effort is normal. No respiratory distress.     Breath sounds: Normal breath sounds. No wheezing.  Musculoskeletal:        General: No swelling. Normal range of motion.     Cervical back: Neck supple.  Lymphadenopathy:     Cervical: No cervical adenopathy.  Skin:    General: Skin is warm and dry.     Findings: No rash.  Neurological:     Mental Status: He is alert and oriented to person, place, and time.     Coordination: Coordination normal.  Psychiatric:        Behavior: Behavior normal.       Assessment & Plan:   Problem List Items Addressed This Visit       Cardiovascular and Mediastinum   Benign essential HTN   Relevant Medications   hydrochlorothiazide (HYDRODIURIL) 25 MG tablet   Other Relevant Orders   CBC with Differential/Platelet   CMP14+EGFR   Lipid panel   Vitamin B12   Coronary artery disease involving native coronary artery of native heart with angina pectoris (HCC)   Relevant Medications   hydrochlorothiazide (HYDRODIURIL) 25 MG tablet   Other Relevant Orders   CBC with Differential/Platelet   CMP14+EGFR   Lipid panel   Vitamin B12     Digestive   GERD (gastroesophageal reflux disease)   Relevant Medications   esomeprazole (NEXIUM) 40 MG capsule     Nervous and Auditory   Shingles (herpes zoster) polyneuropathy   Relevant Medications   gabapentin (NEURONTIN) 400 MG capsule     Other   Pure hypercholesterolemia - Primary   Relevant Medications   hydrochlorothiazide (HYDRODIURIL) 25 MG tablet   Other Relevant Orders   CBC with Differential/Platelet   CMP14+EGFR   Lipid panel   Vitamin B12   Edema of extremities    Relevant Medications   hydrochlorothiazide (HYDRODIURIL) 25 MG tablet   Other Visit Diagnoses     Herpes zoster without complication       Relevant Medications   gabapentin (NEURONTIN) 400 MG capsule   Prostate cancer screening       Vitamin D deficiency       Relevant Orders   Vitamin D, 25-hydroxy   Screening PSA (prostate specific antigen)            Follow  up plan: No follow-ups on file.  Counseling provided for all of the vaccine components Orders Placed This Encounter  Procedures   CBC with Differential/Platelet   CMP14+EGFR   Lipid panel   Vitamin B12   Vitamin D, 25-hydroxy    Arville Care, MD [place] [facility] 01/13/2023, 2:56 PM

## 2023-01-14 LAB — CMP14+EGFR
ALT: 21 [IU]/L (ref 0–44)
AST: 21 [IU]/L (ref 0–40)
Albumin: 4.6 g/dL (ref 3.8–4.8)
Alkaline Phosphatase: 70 [IU]/L (ref 44–121)
BUN/Creatinine Ratio: 12 (ref 10–24)
BUN: 15 mg/dL (ref 8–27)
Bilirubin Total: 2.3 mg/dL — ABNORMAL HIGH (ref 0.0–1.2)
CO2: 23 mmol/L (ref 20–29)
Calcium: 9.9 mg/dL (ref 8.6–10.2)
Chloride: 98 mmol/L (ref 96–106)
Creatinine, Ser: 1.26 mg/dL (ref 0.76–1.27)
Globulin, Total: 2.4 g/dL (ref 1.5–4.5)
Glucose: 90 mg/dL (ref 70–99)
Potassium: 4.3 mmol/L (ref 3.5–5.2)
Sodium: 140 mmol/L (ref 134–144)
Total Protein: 7 g/dL (ref 6.0–8.5)
eGFR: 58 mL/min/{1.73_m2} — ABNORMAL LOW (ref >59)

## 2023-01-14 LAB — CBC WITH DIFFERENTIAL/PLATELET
Basophils Absolute: 0 10*3/uL (ref 0.0–0.2)
Basos: 1 %
EOS (ABSOLUTE): 0.1 10*3/uL (ref 0.0–0.4)
Eos: 1 %
Hematocrit: 54.5 % — ABNORMAL HIGH (ref 37.5–51.0)
Hemoglobin: 17.6 g/dL (ref 13.0–17.7)
Immature Grans (Abs): 0 10*3/uL (ref 0.0–0.1)
Immature Granulocytes: 0 %
Lymphocytes Absolute: 3.6 10*3/uL — ABNORMAL HIGH (ref 0.7–3.1)
Lymphs: 42 %
MCH: 29.1 pg (ref 26.6–33.0)
MCHC: 32.3 g/dL (ref 31.5–35.7)
MCV: 90 fL (ref 79–97)
Monocytes Absolute: 0.7 10*3/uL (ref 0.1–0.9)
Monocytes: 9 %
Neutrophils Absolute: 4.1 10*3/uL (ref 1.4–7.0)
Neutrophils: 47 %
Platelets: 214 10*3/uL (ref 150–450)
RBC: 6.05 x10E6/uL — ABNORMAL HIGH (ref 4.14–5.80)
RDW: 14.1 % (ref 11.6–15.4)
WBC: 8.6 10*3/uL (ref 3.4–10.8)

## 2023-01-14 LAB — LIPID PANEL
Chol/HDL Ratio: 3 ratio (ref 0.0–5.0)
Cholesterol, Total: 110 mg/dL (ref 100–199)
HDL: 37 mg/dL — ABNORMAL LOW (ref >39)
LDL Chol Calc (NIH): 50 mg/dL (ref 0–99)
Triglycerides: 130 mg/dL (ref 0–149)
VLDL Cholesterol Cal: 23 mg/dL (ref 5–40)

## 2023-01-14 LAB — VITAMIN D 25 HYDROXY (VIT D DEFICIENCY, FRACTURES): Vit D, 25-Hydroxy: 62.2 ng/mL (ref 30.0–100.0)

## 2023-01-14 LAB — VITAMIN B12: Vitamin B-12: 666 pg/mL (ref 232–1245)

## 2023-01-24 ENCOUNTER — Ambulatory Visit: Payer: Medicare Other

## 2023-01-24 VITALS — Ht 69.0 in | Wt 206.0 lb

## 2023-01-24 DIAGNOSIS — Z0000 Encounter for general adult medical examination without abnormal findings: Secondary | ICD-10-CM | POA: Diagnosis not present

## 2023-01-24 NOTE — Progress Notes (Signed)
Subjective:   Nathan Ayala. is a 77 y.o. male who presents for Medicare Annual/Subsequent preventive examination.  Visit Complete: Virtual I connected with  [name]. on 01/24/23 by a audio enabled telemedicine application and verified that I am speaking with the correct person using two identifiers.  Patient Location: Home  Provider Location: Home Office  I discussed the limitations of evaluation and management by telemedicine. The patient expressed understanding and agreed to proceed.  Vital Signs: Because this visit was a virtual/telehealth visit, some criteria may be missing or patient reported. Any vitals not documented were not able to be obtained and vitals that have been documented are patient reported.  Cardiac Risk Factors include: advanced age (>16men, >26 women);male gender;hypertension;dyslipidemia     Objective:    Today's Vitals   01/24/23 1335  Weight: 206 lb (93.4 kg)  Height: 5\' 9"  (1.753 m)   Body mass index is 30.42 kg/m.     01/24/2023    1:39 PM 12/10/2021    9:36 AM 06/08/2021   10:30 AM 12/09/2020    9:06 AM 12/22/2018    1:33 PM 05/23/2018   10:43 AM 09/28/2015    5:51 PM  Advanced Directives  Does Patient Have a Medical Advance Directive? No No No No No No No  Would patient like information on creating a medical advance directive? Yes (MAU/Ambulatory/Procedural Areas - Information given) No - Patient declined No - Patient declined No - Patient declined   No - patient declined information    Current Medications (verified) Outpatient Encounter Medications as of 01/24/2023  Medication Sig   Ascorbic Acid (VITAMIN C) 1000 MG tablet Take 1,000 mg by mouth daily.   aspirin 81 MG tablet Take 81 mg by mouth daily.   Cholecalciferol (D3-1000) 25 MCG (1000 UT) capsule Take 1,000 Units by mouth daily.   clopidogrel (PLAVIX) 75 MG tablet Take 1 tablet (75 mg total) by mouth daily.   Coenzyme Q10 (COQ-10) 200 MG CAPS Take 1 capsule by mouth  daily.   esomeprazole (NEXIUM) 40 MG capsule Take 1 capsule (40 mg total) by mouth daily.   gabapentin (NEURONTIN) 400 MG capsule Take 1 capsule (400 mg total) by mouth 3 (three) times daily.   hydrochlorothiazide (HYDRODIURIL) 25 MG tablet TAKE 1 TABLET DAILY   isosorbide mononitrate (IMDUR) 30 MG 24 hr tablet TAKE 1 TABLET TWICE A DAY   nitroGLYCERIN (NITROSTAT) 0.4 MG SL tablet Place 1 tablet (0.4 mg total) under the tongue every 5 (five) minutes as needed for chest pain.   Omega 3 1200 MG CAPS Take 1,200-2,400 mg by mouth 3 (three) times daily. Takes 2 caps in am, 2 caps at lunch, 1 cap in evening   rosuvastatin (CRESTOR) 10 MG tablet TAKE 1 TABLET DAILY (PLEASE KEEP UPCOMING APPOINTMENT WITH DOCTOR TURNER IN [place], IN ORDER TO RECEIVE FUTURE REFILLS, THANK YOU)   vitamin B-12 (CYANOCOBALAMIN) 1000 MCG tablet Take 1,000 mcg by mouth daily.   zinc gluconate 50 MG tablet Take 50 mg by mouth daily.   No facility-administered encounter medications on file as of 01/24/2023.    Allergies (verified) Patient has no known allergies.   History: Past Medical History:  Diagnosis Date   Atypical chest pain    Chronic Secondary to musculoskeletal disease of cervical spine followed by Dr. Anne Hahn   Benign essential HTN 07/30/2014   Chronic chest wall pain 11/25/2016   Coronary artery disease 08/2003   s/p inferior lateral myocardial infarction with PCI of  Proximal RCA. He is also s/p PTCA stenting of Proximal LAD September 2006   Edema extremities 08/01/2017   Hyperlipidemia    Stable angina (HCC)    Past Surgical History:  Procedure Laterality Date   arthroscopic knee surgery     CARDIAC CATHETERIZATION     CORONARY ANGIOPLASTY     heart attack     KIDNEY STONE SURGERY     REPAIR OF PERFORATED ULCER     VASECTOMY     Family History  Problem Relation Age of Onset   Kidney cancer Mother    Hypertension Mother    Diabetes Mother    Cancer Mother    Cancer Father    Social History    Socioeconomic History   Marital status: Married    Spouse name: Maralyn Sago   Number of children: 3   Years of education: Not on file   Highest education level: Master's degree (e.g., MA, MS, MEng, MEd, MSW, MBA)  Occupational History   Occupation: pastor  Tobacco Use   Smoking status: Former    Current packs/day: 0.00    Average packs/day: 1.5 packs/day for 10.0 years (15.0 ttl pk-yrs)    Types: Cigarettes    Start date: 03/01/1960    Quit date: 03/01/1970    Years since quitting: 52.9   Smokeless tobacco: Never  Vaping Use   Vaping status: Never Used  Substance and Sexual Activity   Alcohol use: No    Alcohol/week: 0.0 standard drinks of alcohol   Drug use: No   Sexual activity: Not Currently  Other Topics Concern   Not on file  Social History Narrative   53 years, 3 children, 3 grandchildren   He is a Programmer, multimedia. 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   Social Determinants of Health   Financial Resource Strain: Low Risk  (01/24/2023)   Overall Financial Resource Strain (CARDIA)    Difficulty of Paying Living Expenses: Not hard at all  Food Insecurity: No Food Insecurity (01/24/2023)   Hunger Vital Sign    Worried About Running Out of Food in the Last Year: Never true    Ran Out of Food in the Last Year: Never true  Transportation Needs: No Transportation Needs (01/24/2023)   PRAPARE - Administrator, Civil Service (Medical): No    Lack of Transportation (Non-Medical): No  Physical Activity: Sufficiently Active (01/24/2023)   Exercise Vital Sign    Days of Exercise per Week: 5 days    Minutes of Exercise per Session: 30 min  Stress: No Stress Concern Present (01/24/2023)   [employer] of Occupational Health - Occupational Stress Questionnaire    Feeling of Stress : Not at all  Social Connections: Socially Integrated (01/24/2023)   Social Connection and Isolation Panel [NHANES]    Frequency of  Communication with Friends and Family: More than three times a week    Frequency of Social Gatherings with Friends and Family: Three times a week    Attends Religious Services: More than 4 times per year    Active Member of Clubs or Organizations: Yes    Attends Engineer, structural: More than 4 times per year    Marital Status: Married    Tobacco Counseling Counseling given: Not Answered   Clinical Intake:  Pre-visit preparation completed: Yes  Pain : No/denies pain     Diabetes: No  How often do you need to  have someone help you when you read instructions, pamphlets, or other written materials from your doctor or pharmacy?: 1 - Never  Interpreter Needed?: No  Information entered by :: Kandis Fantasia LPN   Activities of Daily Living    01/24/2023    1:36 PM  In your present state of health, do you have any difficulty performing the following activities:  Hearing? 0  Vision? 0  Difficulty concentrating or making decisions? 0  Walking or climbing stairs? 0  Dressing or bathing? 0  Doing errands, shopping? 0  Preparing Food and eating ? N  Using the Toilet? N  In the past six months, have you accidently leaked urine? N  Do you have problems with loss of bowel control? N  Managing your Medications? N  Managing your Finances? N  Housekeeping or managing your Housekeeping? N    Patient Care Team: Dettinger, Elige Radon, MD as PCP - General (Family Medicine) Quintella Reichert, MD as PCP - Cardiology (Cardiology) Manning Charity, OD as Referring Physician (Optometry)  Indicate any recent Medical Services you may have received from other than [facility] providers in the past year (date may be approximate).     Assessment:   This is a routine wellness examination for Yasiel.  Hearing/Vision screen Hearing Screening - Comments:: Denies hearing difficulties   Vision Screening - Comments:: Wears rx glasses - up to date with routine eye exams with Dr. Manning Charity       Goals Addressed             This Visit's Progress    Remain active and independent        Depression Screen    01/24/2023    1:37 PM 01/13/2023    2:31 PM 01/07/2022    2:04 PM 12/10/2021    9:38 AM 06/12/2021    9:35 AM 01/08/2021   11:30 AM 12/29/2020    2:15 PM  PHQ 2/9 Scores  PHQ - 2 Score 0 0 0 0 0 0 0  PHQ- 9 Score 0 0 0   0 0    Fall Risk    01/24/2023    1:39 PM 01/13/2023    2:31 PM 01/07/2022    2:04 PM 12/10/2021    9:38 AM 06/12/2021    9:35 AM  Fall Risk   Falls in the past year? 0 0 0 0 0  Number falls in past yr: 0  0    Injury with Fall? 0  0    Risk for fall due to : No Fall Risks      Follow up Falls prevention discussed;Education provided;Falls evaluation completed  Falls evaluation completed      MEDICARE RISK AT HOME: Medicare Risk at Home Any stairs in or around the home?: No If so, are there any without handrails?: No Home free of loose throw rugs in walkways, pet beds, electrical cords, etc?: Yes Adequate lighting in your home to reduce risk of falls?: Yes Life alert?: No Use of a cane, walker or w/c?: No Grab bars in the bathroom?: Yes Shower chair or bench in shower?: No Elevated toilet seat or a handicapped toilet?: Yes  TIMED UP AND GO:  Was the test performed?  No    Cognitive Function:        01/24/2023    1:39 PM 12/10/2021    9:39 AM 12/22/2018    1:38 PM  6CIT Screen  What Year? 0 points 0 points 0 points  What month? 0  points 0 points 0 points  What time? 0 points 0 points 0 points  Count back from 20 0 points 0 points 0 points  Months in reverse 0 points 0 points 0 points  Repeat phrase 0 points 0 points 0 points  Total Score 0 points 0 points 0 points    Immunizations Immunization History  Administered Date(s) Administered   Fluad Quad(high Dose 65+) 12/13/2018, 12/20/2019, 01/08/2021, 01/07/2022   Influenza, High Dose Seasonal PF 01/05/2018   Pneumococcal Conjugate-13 12/13/2018   Pneumococcal  Polysaccharide-23 12/20/2019   Td 12/18/2020    TDAP status: Up to date  Flu Vaccine status: Declined, Education has been provided regarding the importance of this vaccine but patient still declined. Advised may receive this vaccine at local pharmacy or Health Dept. Aware to provide a copy of the vaccination record if obtained from local pharmacy or Health Dept. Verbalized acceptance and understanding.  Pneumococcal vaccine status: Up to date  Covid-19 vaccine status: Declined, Education has been provided regarding the importance of this vaccine but patient still declined. Advised may receive this vaccine at local pharmacy or Health Dept.or vaccine clinic. Aware to provide a copy of the vaccination record if obtained from local pharmacy or Health Dept. Verbalized acceptance and understanding.  Qualifies for Shingles Vaccine? Yes   Zostavax completed No   Shingrix Completed?: No.    Education has been provided regarding the importance of this vaccine. Patient has been advised to call insurance company to determine out of pocket expense if they have not yet received this vaccine. Advised may also receive vaccine at local pharmacy or Health Dept. Verbalized acceptance and understanding.  Screening Tests Health Maintenance  Topic Date Due   Colonoscopy  04/04/2022   COVID-19 Vaccine (1) 01/29/2023 (Originally 10/09/1950)   INFLUENZA VACCINE  05/30/2023 (Originally 09/30/2022)   Hepatitis C Screening  01/13/2024 (Originally 10/09/1963)   Zoster Vaccines- Shingrix (1 of 2) 01/13/2024 (Originally 10/10/1964)   Medicare Annual Wellness (AWV)  01/24/2024   DTaP/Tdap/Td (2 - Tdap) 12/19/2030   Pneumonia Vaccine 31+ Years old  Completed   HPV VACCINES  Aged Out    Health Maintenance  Health Maintenance Due  Topic Date Due   Colonoscopy  04/04/2022    Colorectal cancer screening: No longer required.   Lung Cancer Screening: (Low Dose CT Chest recommended if Age 26-80 years, 20 pack-year  currently smoking OR have quit w/in 15years.) does not qualify.   Lung Cancer Screening Referral: n/a  Additional Screening:  Hepatitis C Screening: does qualify; Patient declines   Vision Screening: Recommended annual ophthalmology exams for early detection of glaucoma and other disorders of the eye. Is the patient up to date with their annual eye exam?  Yes  Who is the provider or what is the name of the office in which the patient attends annual eye exams? Dr. Emily Filbert  If pt is not established with a provider, would they like to be referred to a provider to establish care? No .   Dental Screening: Recommended annual dental exams for proper oral hygiene  Community Resource Referral / Chronic Care Management: CRR required this visit?  No   CCM required this visit?  No     Plan:     I have personally reviewed and noted the following in the patient's chart:   Medical and social history Use of alcohol, tobacco or illicit drugs  Current medications and supplements including opioid prescriptions. Patient is not currently taking opioid prescriptions. Functional ability and status Nutritional  status Physical activity Advanced directives List of other physicians Hospitalizations, surgeries, and ER visits in previous 12 months Vitals Screenings to include cognitive, depression, and falls Referrals and appointments  In addition, I have reviewed and discussed with patient certain preventive protocols, quality metrics, and best practice recommendations. A written personalized care plan for preventive services as well as general preventive health recommendations were provided to patient.     Kandis Fantasia [place], [place]   52/84/1324   After Visit Summary: (MyChart) Due to this being a telephonic visit, the after visit summary with patients personalized plan was offered to patient via MyChart   Nurse Notes: No concerns at this time

## 2023-01-24 NOTE — Patient Instructions (Signed)
Mr. Mare , Thank you for taking time to come for your Medicare Wellness Visit. I appreciate your ongoing commitment to your health goals. Please review the following plan we discussed and let me know if I can assist you in the future.   Referrals/Orders/Follow-Ups/Clinician Recommendations: Aim for 30 minutes of exercise or brisk walking, 6-8 glasses of water, and 5 servings of fruits and vegetables each day.  This is a list of the screening recommended for you and due dates:  Health Maintenance  Topic Date Due   Colon Cancer Screening  04/04/2022   COVID-19 Vaccine (1) 01/29/2023*   Flu Shot  05/30/2023*   Hepatitis C Screening  01/13/2024*   Zoster (Shingles) Vaccine (1 of 2) 01/13/2024*   Medicare Annual Wellness Visit  01/24/2024   DTaP/Tdap/Td vaccine (2 - Tdap) 12/19/2030   Pneumonia Vaccine  Completed   HPV Vaccine  Aged Out  *Topic was postponed. The date shown is not the original due date.    Advanced directives: (ACP Link)Information on Advanced Care Planning can be found at [facility] [place] Advance Health Care Directives Advance Health Care Directives ([email or web address]   Next Medicare Annual Wellness Visit scheduled for next year: Yes

## 2023-02-16 ENCOUNTER — Other Ambulatory Visit: Payer: Self-pay | Admitting: Cardiology

## 2023-02-16 DIAGNOSIS — R600 Localized edema: Secondary | ICD-10-CM

## 2023-02-16 DIAGNOSIS — I25119 Atherosclerotic heart disease of native coronary artery with unspecified angina pectoris: Secondary | ICD-10-CM

## 2023-02-16 DIAGNOSIS — I10 Essential (primary) hypertension: Secondary | ICD-10-CM

## 2023-02-16 DIAGNOSIS — E7800 Pure hypercholesterolemia, unspecified: Secondary | ICD-10-CM

## 2023-03-07 ENCOUNTER — Ambulatory Visit: Payer: Medicare Other | Admitting: Cardiology

## 2023-03-11 ENCOUNTER — Ambulatory Visit: Payer: Medicare Other | Admitting: Cardiology

## 2023-04-13 ENCOUNTER — Other Ambulatory Visit: Payer: Self-pay | Admitting: Cardiology

## 2023-04-13 DIAGNOSIS — E7800 Pure hypercholesterolemia, unspecified: Secondary | ICD-10-CM

## 2023-04-13 DIAGNOSIS — I10 Essential (primary) hypertension: Secondary | ICD-10-CM

## 2023-04-13 DIAGNOSIS — I25119 Atherosclerotic heart disease of native coronary artery with unspecified angina pectoris: Secondary | ICD-10-CM

## 2023-04-13 DIAGNOSIS — R600 Localized edema: Secondary | ICD-10-CM

## 2023-05-16 ENCOUNTER — Encounter: Payer: Self-pay | Admitting: Cardiology

## 2023-05-16 ENCOUNTER — Ambulatory Visit: Attending: Cardiology | Admitting: Cardiology

## 2023-05-16 VITALS — BP 122/68 | HR 68 | Resp 16 | Ht 69.0 in | Wt 204.2 lb

## 2023-05-16 DIAGNOSIS — R600 Localized edema: Secondary | ICD-10-CM | POA: Diagnosis not present

## 2023-05-16 DIAGNOSIS — I10 Essential (primary) hypertension: Secondary | ICD-10-CM | POA: Insufficient documentation

## 2023-05-16 DIAGNOSIS — E7800 Pure hypercholesterolemia, unspecified: Secondary | ICD-10-CM | POA: Insufficient documentation

## 2023-05-16 DIAGNOSIS — I25119 Atherosclerotic heart disease of native coronary artery with unspecified angina pectoris: Secondary | ICD-10-CM | POA: Diagnosis not present

## 2023-05-16 NOTE — Progress Notes (Signed)
 History of Present Illness:       Date:  05/16/2023   ID:  Nathan Ayala., DOB [birth year], MRN [id number]   PCP:  Dettinger, Elige Radon, MD  Cardiologist:  Armanda Magic, MD  Electrophysiologist:  None   Chief Complaint:  CAD, HLD, HTN  History of Present Illness:    Nathan Ayala is a 78 y.o. male with a hx of ASCAD (s/p inferior lateral myocardial infarction with PCI of Proximal RCA. He is also s/p PTCA stenting of Proximal LAD September 2006), chronic chest wall pain, dyslipidemia and HTN. He has chronic stable angina that usually occurs if he works too hard out in the heat in his yard.  It usually resolves with one sublingual nitroglycerin.   Nuclear stress test 11/2015 showed no inducible ischemia.  He is here today for followup and is doing well.  He has chronic angina which remains stable.  He has only had to take NTG once in this year a few months ago and was short lived pain. He denies any  SOB, DOE, PND, orthopnea, LE edema, palpitations or syncope. He occasionally has some dizziness. He is compliant with his meds and is tolerating meds with no SE.    Prior CV studies:   The following studies were reviewed today:  EKG  Past Medical History:  Diagnosis Date   Atypical chest pain    Chronic Secondary to musculoskeletal disease of cervical spine followed by Dr. Anne Hahn   Benign essential HTN 07/30/2014   Chronic chest wall pain 11/25/2016   Coronary artery disease 08/2003   s/p inferior lateral myocardial infarction with PCI of Proximal RCA. He is also s/p PTCA stenting of Proximal LAD September 2006   Edema extremities 08/01/2017   Hyperlipidemia    Stable angina (HCC)    Past Surgical History:  Procedure Laterality Date   arthroscopic knee surgery     CARDIAC CATHETERIZATION     CORONARY ANGIOPLASTY     heart attack     KIDNEY STONE SURGERY     REPAIR OF PERFORATED ULCER     VASECTOMY       Current Meds  Medication Sig   Ascorbic Acid (VITAMIN C) 1000 MG tablet  Take 1,000 mg by mouth daily.   aspirin 81 MG tablet Take 81 mg by mouth daily.   Cholecalciferol (D3-1000) 25 MCG (1000 UT) capsule Take 1,000 Units by mouth daily.   clopidogrel (PLAVIX) 75 MG tablet TAKE 1 TABLET DAILY   Coenzyme Q10 (COQ-10) 100 MG CAPS Take 1 capsule by mouth daily.   esomeprazole (NEXIUM) 40 MG capsule Take 1 capsule (40 mg total) by mouth daily.   gabapentin (NEURONTIN) 400 MG capsule Take 1 capsule (400 mg total) by mouth 3 (three) times daily.   hydrochlorothiazide (HYDRODIURIL) 25 MG tablet TAKE 1 TABLET DAILY   isosorbide mononitrate (IMDUR) 30 MG 24 hr tablet TAKE 1 TABLET TWICE A DAY   nitroGLYCERIN (NITROSTAT) 0.4 MG SL tablet Place 1 tablet (0.4 mg total) under the tongue every 5 (five) minutes as needed for chest pain.   Omega 3 1200 MG CAPS Take 1,200-2,400 mg by mouth 3 (three) times daily. Takes 2 caps in am, 2 caps at lunch, 1 cap in evening   rosuvastatin (CRESTOR) 10 MG tablet TAKE 1 TABLET DAILY   vitamin B-12 (CYANOCOBALAMIN) 1000 MCG tablet Take 1,000 mcg by mouth daily.   zinc gluconate 50 MG tablet Take 50 mg by mouth daily.  Allergies:   Patient has no known allergies.   Social History   Tobacco Use   Smoking status: Former    Current packs/day: 0.00    Average packs/day: 1.5 packs/day for 10.0 years (15.0 ttl pk-yrs)    Types: Cigarettes    Start date: 03/01/1960    Quit date: 03/01/1970    Years since quitting: 53.2   Smokeless tobacco: Never  Vaping Use   Vaping status: Never Used  Substance Use Topics   Alcohol use: No    Alcohol/week: 0.0 standard drinks of alcohol   Drug use: No     Family Hx: The patient's family history includes Cancer in his father and mother; Diabetes in his mother; Hypertension in his mother; Kidney cancer in his mother.  ROS:   Please see the history of present illness.    none All other systems reviewed and are negative.   Labs/Other Tests and Data Reviewed:    EKG  Interpretation Date/Time:  Monday May 16 2023 13:35:49 EDT Ventricular Rate:  70 PR Interval:  194 QRS Duration:  92 QT Interval:  410 QTC Calculation: 442 R Axis:   1  Text Interpretation: Normal sinus rhythm When compared with ECG of 28-Sep-2015 17:51, no significant change from 09/28/2015 Confirmed by Armanda Magic [phone]) on 05/16/2023 2:03:22 PM    Recent Labs: 01/13/2023: ALT 21; BUN 15; Creatinine, Ser 1.26; Hemoglobin 17.6; Platelets 214; Potassium 4.3; Sodium 140   Recent Lipid Panel Lab Results  Component Value Date/Time   CHOL 110 01/13/2023 03:03 PM   TRIG 130 01/13/2023 03:03 PM   HDL 37 (L) 01/13/2023 03:03 PM   CHOLHDL 3.0 01/13/2023 03:03 PM   CHOLHDL 3.7 01/31/2015 10:44 AM   LDLCALC 50 01/13/2023 03:03 PM    Wt Readings from Last 3 Encounters:  05/16/23 204 lb 3.2 oz (92.6 kg)  01/24/23 206 lb (93.4 kg)  01/13/23 206 lb (93.4 kg)     Objective:    Vital Signs:  BP 122/68 (BP Location: Right Arm, Patient Position: Sitting, Cuff Size: Normal)   Pulse 68   Resp 16   Ht 5\' 9"  (1.753 m)   Wt 204 lb 3.2 oz (92.6 kg)   SpO2 97%   BMI 30.16 kg/m   GEN: Well nourished, well developed in no acute distress HEENT: Normal NECK: No JVD; No carotid bruits LYMPHATICS: No lymphadenopathy CARDIAC:RRR, no murmurs, rubs, gallops RESPIRATORY:  Clear to auscultation without rales, wheezing or rhonchi  ABDOMEN: Soft, non-tender, non-distended MUSCULOSKELETAL:  No edema; No deformity  SKIN: Warm and dry NEUROLOGIC:  Alert and oriented x 3 PSYCHIATRIC:  Normal affect  ASSESSMENT & PLAN:      1.  ASCAD with chronic stable angina - s/p inferior lateral myocardial infarction with PCI of Proximal RCA. He is also s/p PTCA stenting of Proximal LAD September 2006.   -He has chronic stable angina and has only had to take nitroglycerin once since I saw him a year ago -Continue prescription Drug Management with Aspirin 81 Mg Daily, Plavix 75 Mg Daily, Imdur 30 Mg BID, Crestor  10 Mg Daily with As Needed Refills  2.  HTN  -BP controlled on exam today -I have personally reviewed and interpreted outside labs performed by patient's PCP which showed serum creatinine 1.26, potassium 4.3 on 01/13/2023 -Continue HCTZ 25 mg daily with as needed refills   3.  HLD  -LDL goal < 70 -I have personally reviewed and interpreted outside labs performed by patient's PCP which showed LDL  50, HDL 37, triglycerides 130 and ALT 21 on 01/13/2023 -Continue prescription drug management with Crestor 10 mg daily with as needed refills  4.  LE edema  -He has not really had any significant problems with edema  -Continue HCTZ 25 mg daily with as needed refills   Medication Adjustments/Labs and Tests Ordered: Current medicines are reviewed at length with the patient today.  Concerns regarding medicines are outlined above.  Tests Ordered: No orders of the defined types were placed in this encounter.  Medication Changes: No orders of the defined types were placed in this encounter.   Disposition:  Follow up in 1 year(s)  Signed, Armanda Magic, MD  10/31/2018 10:26 AM    [facility]

## 2023-05-16 NOTE — Patient Instructions (Addendum)
Medication Instructions:  Your physician recommends that you continue on your current medications as directed. Please refer to the Current Medication list given to you today.  *If you need a refill on your cardiac medications before your next appointment, please call your pharmacy*  Follow-Up: At [facility], you and your health needs are our priority.  As part of our continuing mission to provide you with exceptional heart care, we have created designated Provider Care Teams.  These Care Teams include your primary Cardiologist (physician) and Advanced Practice Providers (APPs -  Physician Assistants and Nurse Practitioners) who all work together to provide you with the care you need, when you need it.  Your next appointment:   1 year  Provider:   Armanda Magic, MD

## 2023-05-17 ENCOUNTER — Other Ambulatory Visit: Payer: Self-pay | Admitting: Cardiology

## 2023-05-17 DIAGNOSIS — E7800 Pure hypercholesterolemia, unspecified: Secondary | ICD-10-CM

## 2023-05-17 DIAGNOSIS — I25119 Atherosclerotic heart disease of native coronary artery with unspecified angina pectoris: Secondary | ICD-10-CM

## 2023-05-17 DIAGNOSIS — I10 Essential (primary) hypertension: Secondary | ICD-10-CM

## 2023-05-17 DIAGNOSIS — R600 Localized edema: Secondary | ICD-10-CM

## 2023-05-30 ENCOUNTER — Ambulatory Visit: Payer: Medicare Other | Admitting: Cardiology

## 2023-06-14 IMAGING — CR DG LUMBAR SPINE COMPLETE 4+V
5 series · 5 of 5 positions shown · non-contrast
Comparison: None.

CLINICAL DATA: Acute left hip pain.  No reported injury.

EXAM:
LUMBAR SPINE - COMPLETE 4+ VIEW

[l-spine ap]
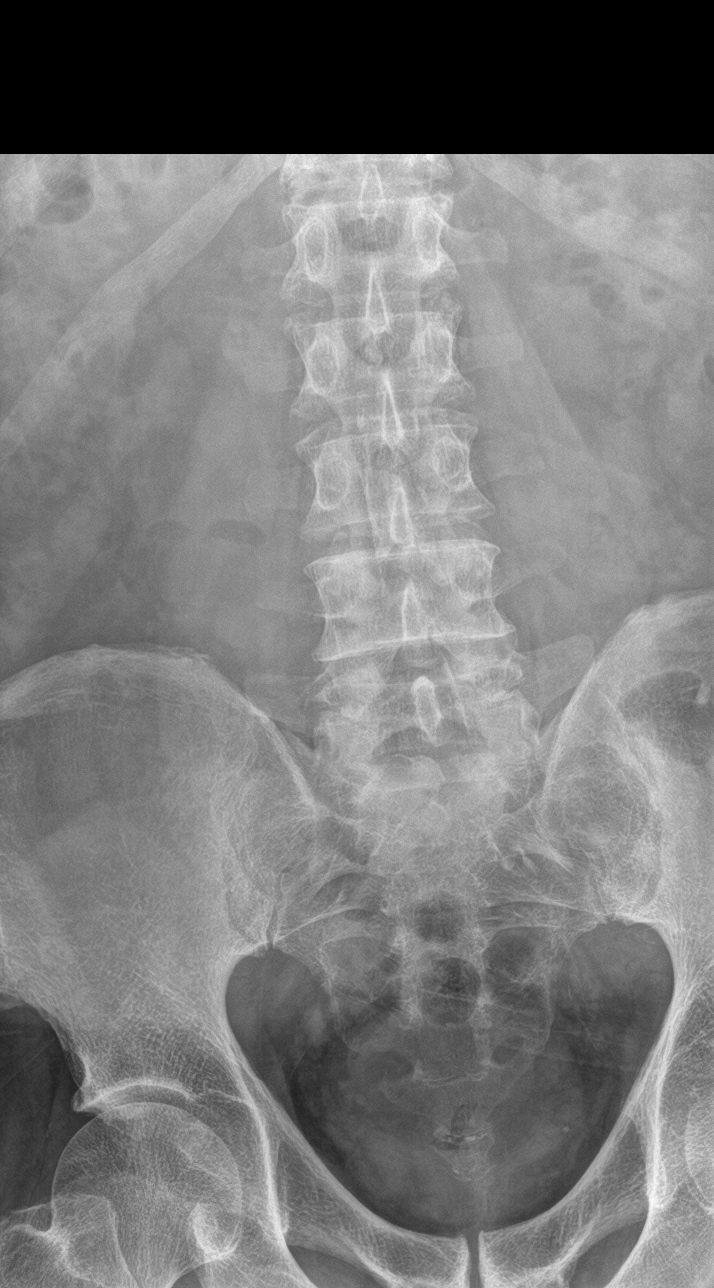

[l-spine obl (1 of 2)]
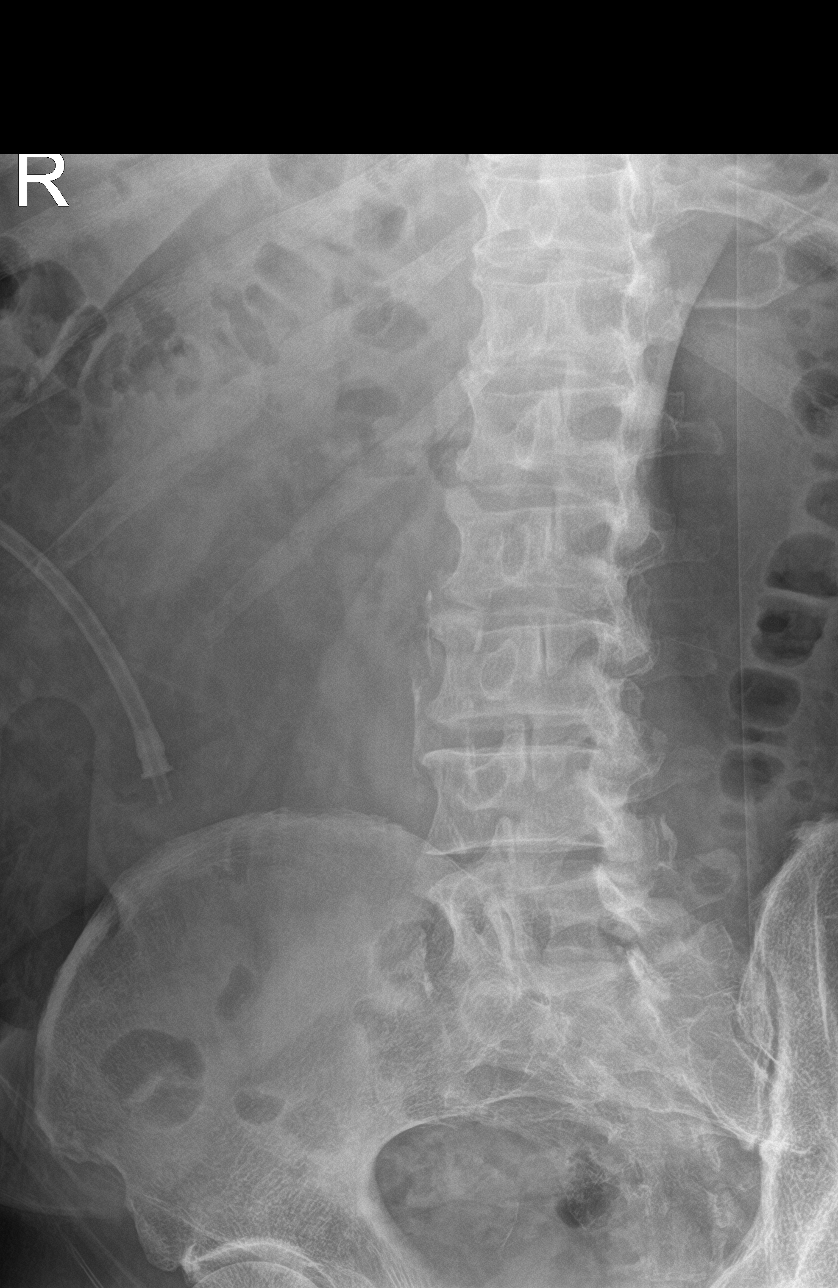

[l-spine obl (2 of 2)]
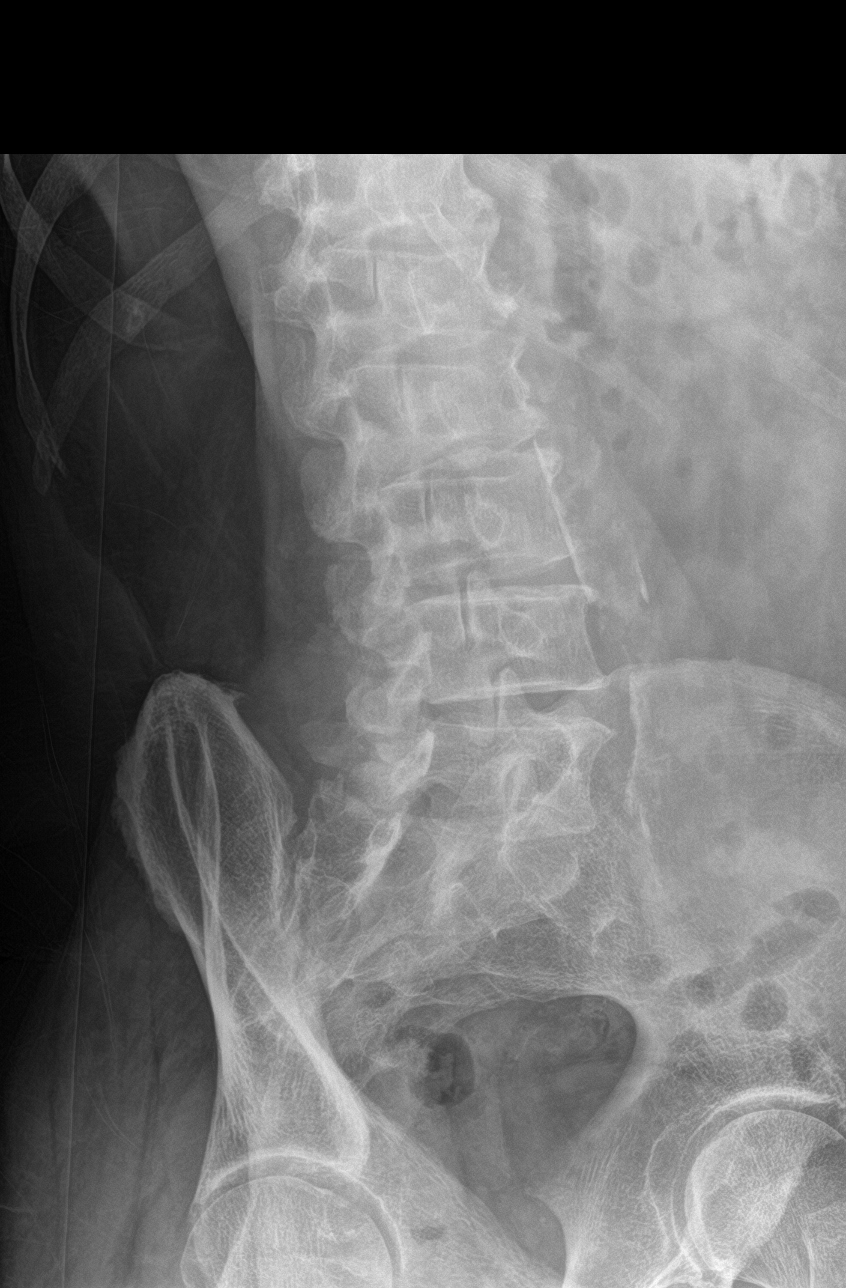

[l-spine lat]
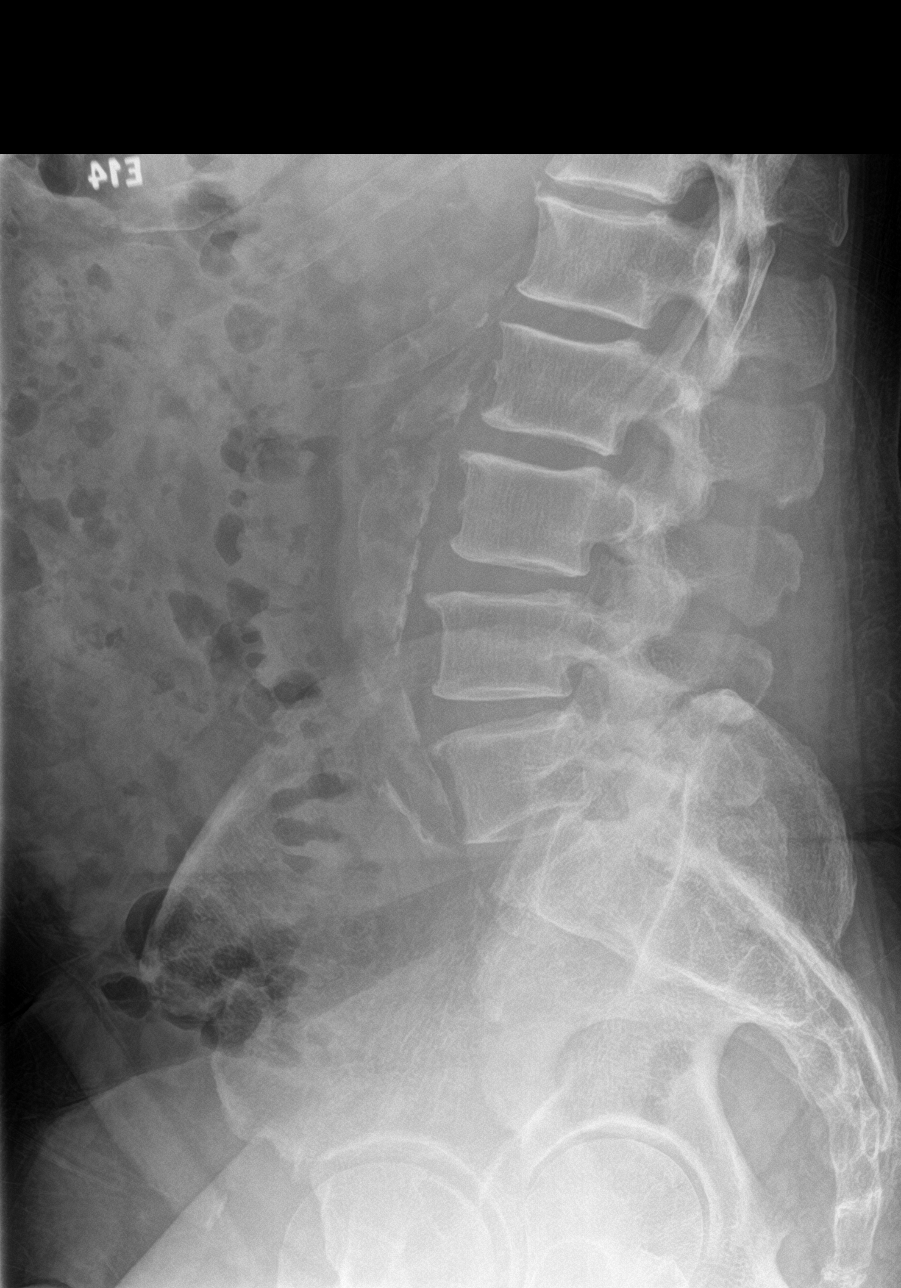

[l-spine spot]
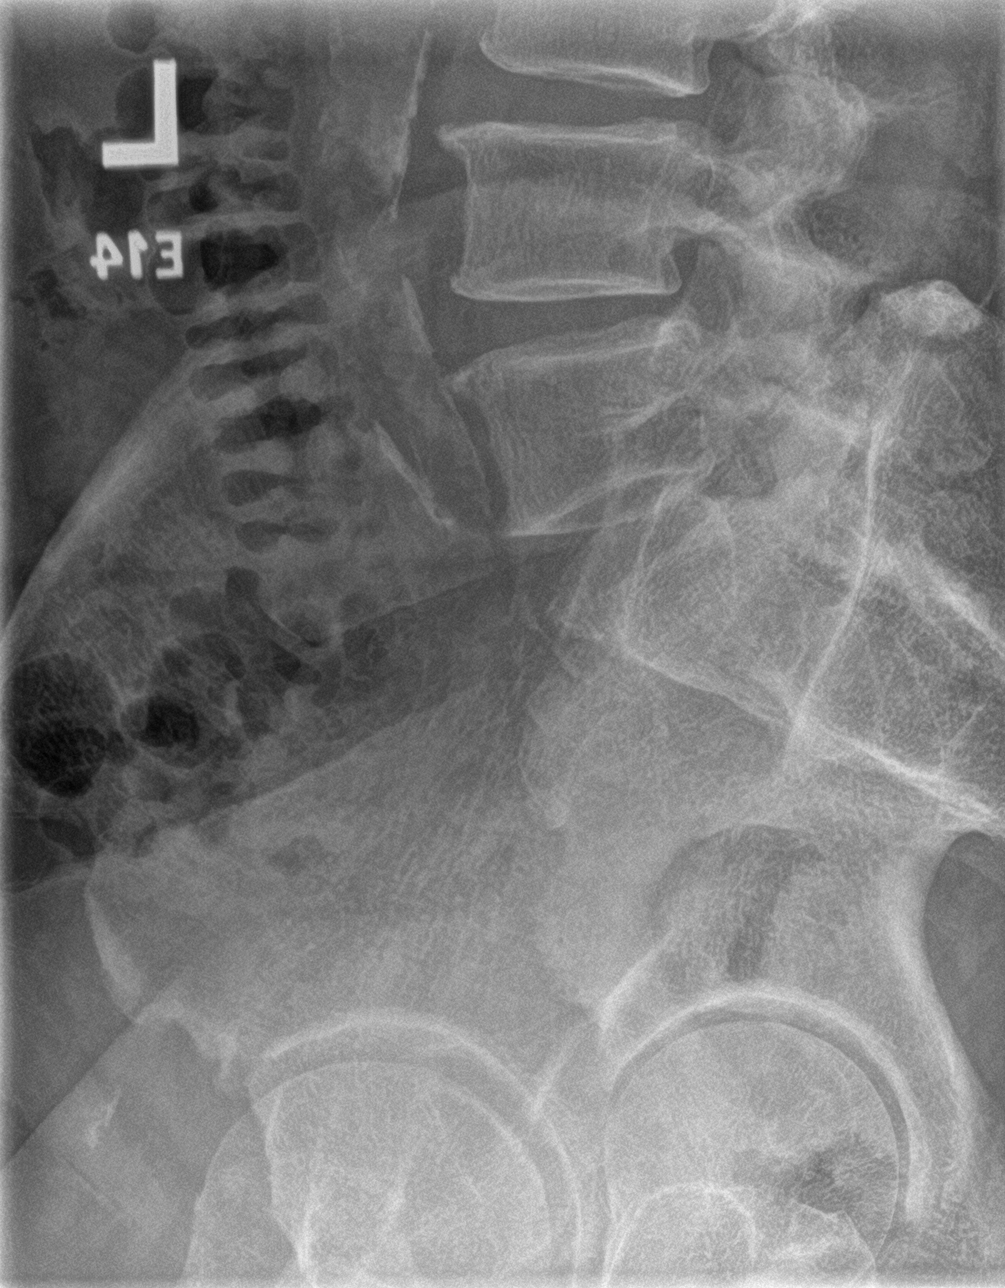

[5 of 5 positions shown; findings below may reference images not displayed]

FINDINGS: There is no evidence of lumbar spine fracture. Alignment is normal.
Intervertebral disc spaces are maintained. Mild anterior osteophyte
formation is noted at L2-3 and L3-4.
IMPRESSION: Mild multilevel degenerative changes are noted. No acute abnormality
seen.

Aortic Atherosclerosis (VKLB0-660.0).

## 2023-06-30 ENCOUNTER — Other Ambulatory Visit: Payer: Self-pay | Admitting: Cardiology

## 2023-06-30 DIAGNOSIS — I10 Essential (primary) hypertension: Secondary | ICD-10-CM

## 2023-06-30 DIAGNOSIS — E7800 Pure hypercholesterolemia, unspecified: Secondary | ICD-10-CM

## 2023-06-30 DIAGNOSIS — I25119 Atherosclerotic heart disease of native coronary artery with unspecified angina pectoris: Secondary | ICD-10-CM

## 2023-06-30 DIAGNOSIS — R600 Localized edema: Secondary | ICD-10-CM

## 2023-07-12 ENCOUNTER — Other Ambulatory Visit: Payer: Self-pay | Admitting: Cardiology

## 2023-07-12 DIAGNOSIS — I25119 Atherosclerotic heart disease of native coronary artery with unspecified angina pectoris: Secondary | ICD-10-CM

## 2023-07-12 DIAGNOSIS — I10 Essential (primary) hypertension: Secondary | ICD-10-CM

## 2023-07-12 DIAGNOSIS — E7800 Pure hypercholesterolemia, unspecified: Secondary | ICD-10-CM

## 2023-07-12 DIAGNOSIS — R600 Localized edema: Secondary | ICD-10-CM

## 2023-08-01 ENCOUNTER — Other Ambulatory Visit: Payer: Self-pay | Admitting: Family Medicine

## 2023-08-01 DIAGNOSIS — B029 Zoster without complications: Secondary | ICD-10-CM

## 2023-08-01 DIAGNOSIS — B0223 Postherpetic polyneuropathy: Secondary | ICD-10-CM

## 2023-08-09 ENCOUNTER — Other Ambulatory Visit: Payer: Self-pay | Admitting: Family Medicine

## 2023-08-09 DIAGNOSIS — R600 Localized edema: Secondary | ICD-10-CM

## 2023-08-09 DIAGNOSIS — I10 Essential (primary) hypertension: Secondary | ICD-10-CM

## 2023-08-09 DIAGNOSIS — E7800 Pure hypercholesterolemia, unspecified: Secondary | ICD-10-CM

## 2023-08-09 DIAGNOSIS — I25119 Atherosclerotic heart disease of native coronary artery with unspecified angina pectoris: Secondary | ICD-10-CM

## 2023-11-28 DIAGNOSIS — Z860101 Personal history of adenomatous and serrated colon polyps: Secondary | ICD-10-CM | POA: Diagnosis not present

## 2023-11-28 DIAGNOSIS — D125 Benign neoplasm of sigmoid colon: Secondary | ICD-10-CM | POA: Diagnosis not present

## 2023-11-28 DIAGNOSIS — Z800 Family history of malignant neoplasm of digestive organs: Secondary | ICD-10-CM | POA: Diagnosis not present

## 2023-11-28 DIAGNOSIS — K635 Polyp of colon: Secondary | ICD-10-CM | POA: Diagnosis not present

## 2023-11-28 DIAGNOSIS — K5730 Diverticulosis of large intestine without perforation or abscess without bleeding: Secondary | ICD-10-CM | POA: Diagnosis not present

## 2023-11-28 DIAGNOSIS — Z1211 Encounter for screening for malignant neoplasm of colon: Secondary | ICD-10-CM | POA: Diagnosis not present

## 2023-11-28 DIAGNOSIS — Z09 Encounter for follow-up examination after completed treatment for conditions other than malignant neoplasm: Secondary | ICD-10-CM | POA: Diagnosis not present

## 2023-12-01 DIAGNOSIS — E785 Hyperlipidemia, unspecified: Secondary | ICD-10-CM | POA: Diagnosis not present

## 2023-12-01 DIAGNOSIS — I10 Essential (primary) hypertension: Secondary | ICD-10-CM | POA: Diagnosis not present

## 2023-12-01 DIAGNOSIS — I2510 Atherosclerotic heart disease of native coronary artery without angina pectoris: Secondary | ICD-10-CM | POA: Diagnosis not present

## 2023-12-01 DIAGNOSIS — K219 Gastro-esophageal reflux disease without esophagitis: Secondary | ICD-10-CM | POA: Diagnosis not present

## 2023-12-19 ENCOUNTER — Telehealth: Payer: Self-pay | Admitting: Cardiology

## 2023-12-19 DIAGNOSIS — I25119 Atherosclerotic heart disease of native coronary artery with unspecified angina pectoris: Secondary | ICD-10-CM

## 2023-12-19 DIAGNOSIS — E7800 Pure hypercholesterolemia, unspecified: Secondary | ICD-10-CM

## 2023-12-19 DIAGNOSIS — R600 Localized edema: Secondary | ICD-10-CM

## 2023-12-19 DIAGNOSIS — I10 Essential (primary) hypertension: Secondary | ICD-10-CM

## 2023-12-19 MED ORDER — CLOPIDOGREL BISULFATE 75 MG PO TABS: 75.0000 mg | ORAL_TABLET | Freq: Every day | ORAL | 0 refills | Status: AC

## 2023-12-19 NOTE — Telephone Encounter (Signed)
*  STAT* If patient is at the pharmacy, call can be transferred to refill team.   1. Which medications need to be refilled? (please list name of each medication and dose if known)   clopidogrel  (PLAVIX ) 75 MG tablet    2. Which pharmacy/location (including street and city if local pharmacy) is medication to be sent to?  [id number] - [place], [place] - [address]      3. Do they need a 30 day or 90 day supply? 10 day supply    Pt is out of medication and was advised by [facility] to get a short supply until his is mailed out from them in 10 days.

## 2023-12-19 NOTE — Telephone Encounter (Signed)
 RX sent in

## 2024-01-16 ENCOUNTER — Encounter: Payer: Medicare Other | Admitting: Family Medicine

## 2024-01-23 ENCOUNTER — Telehealth: Payer: Self-pay | Admitting: Cardiology

## 2024-01-23 DIAGNOSIS — I25119 Atherosclerotic heart disease of native coronary artery with unspecified angina pectoris: Secondary | ICD-10-CM

## 2024-01-23 DIAGNOSIS — I10 Essential (primary) hypertension: Secondary | ICD-10-CM

## 2024-01-23 DIAGNOSIS — R600 Localized edema: Secondary | ICD-10-CM

## 2024-01-23 DIAGNOSIS — E7800 Pure hypercholesterolemia, unspecified: Secondary | ICD-10-CM

## 2024-01-23 MED ORDER — ISOSORBIDE MONONITRATE ER 30 MG PO TB24: 30.0000 mg | ORAL_TABLET | Freq: Two times a day (BID) | ORAL | 1 refills | Status: AC

## 2024-01-23 NOTE — Telephone Encounter (Signed)
*  STAT* If patient is at the pharmacy, call can be transferred to refill team.   1. Which medications need to be refilled? (please list name of each medication and dose if known) isosorbide mononitrate (IMDUR) 30 MG 24 hr tablet   2. Which pharmacy/location (including street and city if local pharmacy) is medication to be sent to?  [facility] [place], [place]   3. Do they need a 30 day or 90 day supply? [place]

## 2024-01-23 NOTE — Telephone Encounter (Signed)
 Refill sent.

## 2024-01-23 NOTE — Telephone Encounter (Signed)
*  STAT* If patient is at the pharmacy, call can be transferred to refill team.   1. Which medications need to be refilled? (please list name of each medication and dose if known) isosorbide  mononitrate (IMDUR ) 30 MG 24 hr tablet   2. Which pharmacy/location (including street and city if local pharmacy) is medication to be sent to? [id number] - [place], [place] - [address]   3. Do they need a 30 day or 90 day supply? 10 day supple until the [facility]

## 2024-01-31 ENCOUNTER — Telehealth: Payer: Self-pay | Admitting: Cardiology

## 2024-01-31 NOTE — Telephone Encounter (Signed)
*  STAT* If patient is at the pharmacy, call can be transferred to refill team.   1. Which medications need to be refilled? (please list name of each medication and dose if known)   nitroGLYCERIN  (NITROSTAT ) 0.4 MG SL tablet    2. Which pharmacy/location (including street and city if local pharmacy) is medication to be sent to? [id number] - [place], [place] - [address]  3. Do they need a 30 day or 90 day supply? 30 Day  Patient is out of medication

## 2024-02-01 MED ORDER — NITROGLYCERIN 0.4 MG SL SUBL: 0.4000 mg | SUBLINGUAL_TABLET | SUBLINGUAL | 4 refills | Status: AC | PRN

## 2024-02-01 NOTE — Telephone Encounter (Signed)
 Refill sent

## 2024-05-16 ENCOUNTER — Ambulatory Visit: Admitting: Nurse Practitioner
